# Patient Record
Sex: Male | Born: 1985 | Race: White | Hispanic: No | Marital: Single | Smoking: Never smoker
Health system: Southern US, Community
[De-identification: ages and names within clinical notes are randomized; demographics above are authoritative.]

## PROBLEM LIST (undated history)

## (undated) DIAGNOSIS — E538 Deficiency of other specified B group vitamins: Secondary | ICD-10-CM

## (undated) DIAGNOSIS — F4310 Post-traumatic stress disorder, unspecified: Secondary | ICD-10-CM

## (undated) DIAGNOSIS — D751 Secondary polycythemia: Secondary | ICD-10-CM

## (undated) HISTORY — PX: COLONOSCOPY W/ POLYPECTOMY: SHX1380

## (undated) HISTORY — PX: OTHER SURGICAL HISTORY: SHX169

## (undated) HISTORY — DX: Secondary polycythemia: D75.1

## (undated) HISTORY — PX: WISDOM TOOTH EXTRACTION: SHX21

## (undated) HISTORY — DX: Deficiency of other specified B group vitamins: E53.8

## (undated) HISTORY — DX: Post-traumatic stress disorder, unspecified: F43.10

---

## 2005-02-18 ENCOUNTER — Emergency Department: Payer: Self-pay | Admitting: Unknown Physician Specialty

## 2005-02-18 ENCOUNTER — Other Ambulatory Visit: Payer: Self-pay

## 2005-02-19 ENCOUNTER — Other Ambulatory Visit: Payer: Self-pay

## 2005-11-18 ENCOUNTER — Emergency Department: Payer: Self-pay | Admitting: Unknown Physician Specialty

## 2009-02-05 ENCOUNTER — Emergency Department: Payer: Self-pay | Admitting: Emergency Medicine

## 2009-03-14 ENCOUNTER — Ambulatory Visit: Payer: Self-pay | Admitting: General Surgery

## 2012-05-25 ENCOUNTER — Encounter: Payer: Self-pay | Admitting: *Deleted

## 2012-06-03 ENCOUNTER — Encounter: Payer: Self-pay | Admitting: General Surgery

## 2012-06-11 ENCOUNTER — Encounter: Payer: Self-pay | Admitting: *Deleted

## 2015-01-02 ENCOUNTER — Emergency Department
Admission: EM | Admit: 2015-01-02 | Discharge: 2015-01-02 | Disposition: A | Discharge status: Home / Self Care | Payer: BLUE CROSS/BLUE SHIELD | Attending: Emergency Medicine | Admitting: Emergency Medicine

## 2015-01-02 ENCOUNTER — Encounter: Payer: Self-pay | Admitting: Emergency Medicine

## 2015-01-02 DIAGNOSIS — H16132 Photokeratitis, left eye: Secondary | ICD-10-CM | POA: Diagnosis not present

## 2015-01-02 DIAGNOSIS — H16133 Photokeratitis, bilateral: Secondary | ICD-10-CM

## 2015-01-02 DIAGNOSIS — H16131 Photokeratitis, right eye: Secondary | ICD-10-CM | POA: Insufficient documentation

## 2015-01-02 DIAGNOSIS — H5713 Ocular pain, bilateral: Secondary | ICD-10-CM | POA: Diagnosis present

## 2015-01-02 MED ORDER — OXYCODONE-ACETAMINOPHEN 5-325 MG PO TABS: 1.0000 | ORAL_TABLET | ORAL | Status: DC | PRN

## 2015-01-02 MED ORDER — ERYTHROMYCIN 5 MG/GM OP OINT: 1.0000 "application " | TOPICAL_OINTMENT | Freq: Three times a day (TID) | OPHTHALMIC | Status: DC

## 2015-01-02 MED ORDER — ERYTHROMYCIN 5 MG/GM OP OINT
TOPICAL_OINTMENT | Freq: Once | OPHTHALMIC | Status: AC
  Administered 2015-01-02: 1 via OPHTHALMIC
  Filled 2015-01-02: qty 1

## 2015-01-02 NOTE — ED Notes (Signed)
Pt in with co pain and redness to both eyes after using torch at  Home with minimal eye protection.

## 2015-01-02 NOTE — ED Provider Notes (Signed)
[facility] Emergency Department Provider Note  ____________________________________________  Time seen: Approximately 11:17 PM  I have reviewed the triage vital signs and the nursing notes.   HISTORY  Chief Complaint Eye Pain    HPI GAWAIN TROKEY is a 29 y.o. male patient presents with redness to pain in both eyes after welding. Patient states he used minimal eye protection. Denies any direct trauma to his eyes.States welded for approximately 30 minutes   History reviewed. No pertinent past medical history.  There are no active problems to display for this patient.   Past Surgical History  Procedure Laterality Date  . Colonoscopy w/ polypectomy  [phone]    Dr. Bary Castilla    Current Outpatient Rx  Name  Route  Sig  Dispense  Refill  . erythromycin ophthalmic ointment   Both Eyes   Place 1 application into both eyes 3 (three) times daily.   3.5 g   0   . oxyCODONE-acetaminophen (ROXICET) 5-325 MG tablet   Oral   Take 1-2 tablets by mouth every 4 (four) hours as needed for severe pain.   15 tablet   0     Allergies Review of patient's allergies indicates no known allergies.  Family History  Problem Relation Age of Onset  . Colon polyps Sister   . Colon polyps Maternal Grandfather     Social History Social History  Substance Use Topics  . Smoking status: Never Smoker   . Smokeless tobacco: None  . Alcohol Use: No    Review of Systems Constitutional: No fever/chills Eyes: Positive bilateral eye pain. ENT: No sore throat. Cardiovascular: Denies chest pain. Respiratory: Denies shortness of breath. Gastrointestinal: No abdominal pain.  No nausea, no vomiting.  No diarrhea.  No constipation. Genitourinary: Negative for dysuria. Musculoskeletal: Negative for back pain. Skin: Negative for rash. Neurological: Negative for headaches, focal weakness or numbness.  10-point ROS otherwise  negative.  ____________________________________________   PHYSICAL EXAM:  VITAL SIGNS: ED Triage Vitals  Enc Vitals Group     BP 01/02/15 2313 135/83 mmHg     Pulse Rate 01/02/15 2313 54     Resp 01/02/15 2313 18     Temp 01/02/15 2313 97.6 F (36.4 C)     Temp Source 01/02/15 2313 Oral     SpO2 01/02/15 2313 98 %     Weight 01/02/15 2313 167 lb (75.751 kg)     Height 01/02/15 2313 5\' 9"  (1.753 m)     Head Cir --      Peak Flow --      Pain Score 01/02/15 2314 8     Pain Loc --      Pain Edu? --      Excl. in [place]? --     Constitutional: Alert and oriented. Well appearing and in no acute distress. Eyes: Conjunctivae are erythematous bilaterally.Marland Kitchen PERRL. EOMI. Head: Atraumatic. Nose: No congestion/rhinnorhea. Mouth/Throat: Mucous membranes are moist.  Oropharynx non-erythematous. Neck: No stridor.   Cardiovascular: Normal rate, regular rhythm. Grossly normal heart sounds.  Good peripheral circulation. Respiratory: Normal respiratory effort.  No retractions. Lungs CTAB. Gastrointestinal: Soft and nontender. No distention. No abdominal bruits. No CVA tenderness. Musculoskeletal: No lower extremity tenderness nor edema.  No joint effusions. Neurologic:  Normal speech and language. No gross focal neurologic deficits are appreciated. No gait instability. Skin:  Skin is warm, dry and intact. No rash noted. Psychiatric: Mood and affect are normal. Speech and behavior are normal.  ____________________________________________   LABS (all  labs ordered are listed, but only abnormal results are displayed)  Labs Reviewed - No data to display    PROCEDURES  Procedure(s) performed: None  Critical Care performed: No  ____________________________________________   INITIAL IMPRESSION / ASSESSMENT AND PLAN / ED COURSE  Pertinent labs & imaging results that were available during my care of the patient were reviewed by me and considered in my medical decision making (see chart  for details).  Focal keratitis. Rx given for erythromycin ointment and Percocet for pain. Patient to follow up with PCP or ophthalmology on-call symptoms worsen. Patient voices no other emergency medical complaints at this time. ____________________________________________   FINAL CLINICAL IMPRESSION(S) / ED DIAGNOSES  Final diagnoses:  Photokeratitis of both eyes      Arlyss Repress, PA-C 01/02/15 2331  Orbie Pyo, MD 01/02/15 2342

## 2015-01-02 NOTE — Discharge Instructions (Signed)
How to Use Eye Drops and Eye Ointments  HOW TO APPLY EYE DROPS  Follow these steps when applying eye drops:  1. Wash your hands.  2. Tilt your head back.  3. Put a finger under your eye and use it to gently pull your lower lid downward. Keep that finger in place.  4. Using your other hand, hold the dropper between your thumb and index finger.  5. Position the dropper just over the edge of the lower lid. Hold it as close to your eye as you can without touching the dropper to your eye.  6. Steady your hand. One way to do this is to lean your index finger against your brow.  7. Look up.  8. Slowly and gently squeeze one drop of medicine into your eye.  9. Close your eye.  10. Place a finger between your lower eyelid and your nose. Press gently for 2 minutes. This increases the amount of time that the medicine is exposed to the eye. It also reduces side effects that can develop if the drop gets into the bloodstream through the nose.  HOW TO APPLY EYE OINTMENTS  Follow these steps when applying eye ointments:  1. Wash your hands.  2. Put a finger under your eye and use it to gently pull your lower lid downward. Keep that finger in place.  3. Using your other hand, place the tip of the tube between your thumb and index finger with the remaining fingers braced against your cheek or nose.  4. Hold the tube just over the edge of your lower lid without touching the tube to your lid or eyeball.  5. Look up.  6. Line the inner part of your lower lid with ointment.  7. Gently pull up on your upper lid and look down. This will force the ointment to spread over the surface of the eye.  8. Release the upper lid.  9. If you can, close your eyes for 1-2 minutes.  Do not rub your eyes. If you applied the ointment correctly, your vision will be blurry for a few minutes. This is normal.  ADDITIONAL INFORMATION   Make sure to use the eye drops or ointment as told by your health care provider.   If you have been told to use both eye  drops and an eye ointment, apply the eye drops first, then wait 3-4 minutes before you apply the ointment.   Try not to touch the tip of the dropper or tube to your eye. A dropper or tube that has touched the eye can become contaminated.     This information is not intended to replace advice given to you by your health care provider. Make sure you discuss any questions you have with your health care provider.     Document Released: 05/06/2000 Document Revised: 06/14/2014 Document Reviewed: 01/24/2014  Elsevier Interactive Patient Education 2016 [employer]

## 2015-02-12 DIAGNOSIS — D751 Secondary polycythemia: Secondary | ICD-10-CM

## 2015-02-12 HISTORY — DX: Secondary polycythemia: D75.1

## 2015-10-05 ENCOUNTER — Encounter: Payer: Self-pay | Admitting: *Deleted

## 2015-10-13 ENCOUNTER — Other Ambulatory Visit: Payer: Self-pay | Admitting: Internal Medicine

## 2015-10-13 DIAGNOSIS — R51 Headache: Principal | ICD-10-CM

## 2015-10-13 DIAGNOSIS — R519 Headache, unspecified: Secondary | ICD-10-CM

## 2015-10-18 ENCOUNTER — Other Ambulatory Visit: Payer: Self-pay | Admitting: Physician Assistant

## 2015-10-18 ENCOUNTER — Ambulatory Visit
Admission: RE | Admit: 2015-10-18 | Discharge: 2015-10-18 | Disposition: A | Discharge status: Home / Self Care | Payer: BLUE CROSS/BLUE SHIELD | Source: Ambulatory Visit | Attending: Physician Assistant | Admitting: Physician Assistant

## 2015-10-18 DIAGNOSIS — R0602 Shortness of breath: Secondary | ICD-10-CM

## 2015-10-18 DIAGNOSIS — J9811 Atelectasis: Secondary | ICD-10-CM | POA: Insufficient documentation

## 2015-10-18 DIAGNOSIS — R911 Solitary pulmonary nodule: Secondary | ICD-10-CM | POA: Insufficient documentation

## 2015-10-18 MED ORDER — IOPAMIDOL (ISOVUE-370) INJECTION 76%: 75.0000 mL | Freq: Once | INTRAVENOUS | Status: DC | PRN

## 2015-10-19 ENCOUNTER — Ambulatory Visit
Admission: RE | Admit: 2015-10-19 | Discharge: 2015-10-19 | Disposition: A | Discharge status: Home / Self Care | Payer: BLUE CROSS/BLUE SHIELD | Source: Ambulatory Visit | Attending: Internal Medicine | Admitting: Internal Medicine

## 2015-10-19 DIAGNOSIS — R519 Headache, unspecified: Secondary | ICD-10-CM

## 2015-10-19 DIAGNOSIS — R51 Headache: Secondary | ICD-10-CM | POA: Diagnosis not present

## 2015-10-25 ENCOUNTER — Inpatient Hospital Stay: Admission: RE | Admit: 2015-10-25 | Payer: BLUE CROSS/BLUE SHIELD | Source: Ambulatory Visit

## 2015-10-25 ENCOUNTER — Ambulatory Visit (INDEPENDENT_AMBULATORY_CARE_PROVIDER_SITE_OTHER): Payer: BLUE CROSS/BLUE SHIELD | Admitting: General Surgery

## 2015-10-25 ENCOUNTER — Encounter: Payer: Self-pay | Admitting: General Surgery

## 2015-10-25 VITALS — BP 122/68 | HR 66 | Resp 16 | Ht 69.0 in | Wt 165.0 lb

## 2015-10-25 DIAGNOSIS — Z86010 Personal history of colonic polyps: Secondary | ICD-10-CM | POA: Diagnosis not present

## 2015-10-25 MED ORDER — POLYETHYLENE GLYCOL 3350 17 GM/SCOOP PO POWD: 1.0000 | Freq: Once | ORAL | 0 refills | Status: AC

## 2015-10-25 NOTE — Progress Notes (Signed)
Patient ID: John Davis, male   DOB: 1985-02-16, 30 y.o.   MRN: [id number]  Chief Complaint  Patient presents with  . Colonoscopy    HPI John Davis is a 30 y.o. male Who presents for a colonoscopy discussion with history of colon polyps and family history of colon cancer. The last colonoscopy was completed in 2011. Denies any gastrointestinal issues. Bowels move regular and no bleeding noted.  He has been being followed for headaches and fatigue for unknown causes.  HPI  Past Medical History:  Diagnosis Date  . Polycythemia 2017   high hemoglobin  . PTSD (post-traumatic stress disorder)     Past Surgical History:  Procedure Laterality Date  . COLONOSCOPY W/ POLYPECTOMY  [phone]   Dr. Bary Castilla    Family History  Problem Relation Age of Onset  . Colon polyps Sister     half sister  . Colon polyps Maternal Grandfather   . Colon cancer Maternal Grandfather   . Colon cancer Mother     Social History Social History  Substance Use Topics  . Smoking status: Never Smoker  . Smokeless tobacco: Never Used  . Alcohol use No    No Known Allergies  No current outpatient prescriptions on file.   No current facility-administered medications for this visit.     Review of Systems Review of Systems  Constitutional: Positive for fatigue.  Respiratory: Negative.   Cardiovascular: Negative.   Neurological: Positive for headaches.    Blood pressure 122/68, pulse 66, resp. rate 16, height 5\' 9"  (1.753 m), weight 165 lb (74.8 kg).  Physical Exam Physical Exam  Constitutional: He is oriented to person, place, and time. He appears well-developed and well-nourished.  HENT:  Mouth/Throat: Oropharynx is clear and moist.  Eyes: Conjunctivae are normal.  Neck: Neck supple.  Cardiovascular: Normal rate, regular rhythm and normal heart sounds.   Pulmonary/Chest: Effort normal and breath sounds normal.  Abdominal: Soft.  Lymphadenopathy:    He has no cervical  adenopathy.  Neurological: He is alert and oriented to person, place, and time.  Skin: Skin is warm and dry.  Psychiatric: His behavior is normal.    Data Reviewed Laboratory studies dated 10/05/2015 completed [facility] showed a hemoglobin of 19.0 with a medicated 55, white blood cell count 7000. B-12 level was low at 192. ( greater than 300) Comprehensive metabolic panel was normal. Ferritin was normal at 42. Erythropoietin level was normal at 5.3. Carbon monoxide level was not elevated.  PCP notes of 10/05/2015 reviewed. Evaluated for temporal headache at that time.  Barium enema dated 04/09/1999 reported a granular appearance in the sigmoid and transverse colon. This prompted colonoscopy.  Colonoscopy report of 05/14/1999 showed no colonic polyps. Biopsy of the terminal ileum was normal.  Colonoscopy dated 03/14/2009 showed multiple polyps within the rectum, sigmoid and descending colon. These were all tubular adenomas. No high-grade dysplasia or malignancy.  Assessment    Candidate for follow-up colonoscopy based on multiple polyps identified in 2011.  Family history of familial polyposis in his mother and maternal grandfather. No known previous genetic testing.    Plan         Colonoscopy with possible biopsy/polypectomy prn: Information regarding the procedure, including its potential risks and complications (including but not limited to perforation of the bowel, which may require emergency surgery to repair, and bleeding) was verbally given to the patient. Educational information regarding lower intestinal endoscopy was given to the patient. Written instructions for how to complete the bowel prep  using Miralax were provided. The importance of drinking ample fluids to avoid dehydration as a result of the prep emphasized.  The patient is scheduled for a Colonoscopy at [facility] on 11/28/15. They are aware to call the day before to get their arrival time. Miralax prescription has been  sent into the patient's pharmacy. The patient is aware of date and instructions.    This information has been scribed by Karie Fetch RN, BSN,BC.   John Davis 10/27/2015, 9:03 AM

## 2015-10-25 NOTE — Patient Instructions (Addendum)
The patient is aware to call back for any questions or concerns.  Colonoscopy A colonoscopy is an exam to look at the entire large intestine (colon). This exam can help find problems such as tumors, polyps, inflammation, and areas of bleeding. The exam takes about 1 hour.  LET [facility] CARE PROVIDER KNOW ABOUT:   Any allergies you have.  All medicines you are taking, including vitamins, herbs, eye drops, creams, and over-the-counter medicines.  Previous problems you or members of your family have had with the use of anesthetics.  Any blood disorders you have.  Previous surgeries you have had.  Medical conditions you have. RISKS AND COMPLICATIONS  Generally, this is a safe procedure. However, as with any procedure, complications can occur. Possible complications include:  Bleeding.  Tearing or rupture of the colon wall.  Reaction to medicines given during the exam.  Infection (rare). BEFORE THE PROCEDURE   Ask your health care provider about changing or stopping your regular medicines.  You may be prescribed an oral bowel prep. This involves drinking a large amount of medicated liquid, starting the day before your procedure. The liquid will cause you to have multiple loose stools until your stool is almost clear or light green. This cleans out your colon in preparation for the procedure.  Do not eat or drink anything else once you have started the bowel prep, unless your health care provider tells you it is safe to do so.  Arrange for someone to drive you home after the procedure. PROCEDURE   You will be given medicine to help you relax (sedative).  You will lie on your side with your knees bent.  A long, flexible tube with a light and camera on the end (colonoscope) will be inserted through the rectum and into the colon. The camera sends video back to a computer screen as it moves through the colon. The colonoscope also releases carbon dioxide gas to inflate the colon.  This helps your health care provider see the area better.  During the exam, your health care provider may take a small tissue sample (biopsy) to be examined under a microscope if any abnormalities are found.  The exam is finished when the entire colon has been viewed. AFTER THE PROCEDURE   Do not drive for 24 hours after the exam.  You may have a small amount of blood in your stool.  You may pass moderate amounts of gas and have mild abdominal cramping or bloating. This is caused by the gas used to inflate your colon during the exam.  Ask when your test results will be ready and how you will get your results. Make sure you get your test results.   This information is not intended to replace advice given to you by your health care provider. Make sure you discuss any questions you have with your health care provider.   Document Released: 01/26/2000 Document Revised: 11/18/2012 Document Reviewed: 10/05/2012 Elsevier Interactive Patient Education [employer].  The patient is scheduled for a Colonoscopy at [facility] on 11/28/15. They are aware to call the day before to get their arrival time. Miralax prescription has been sent into the patient's pharmacy. The patient is aware of date and instructions.

## 2015-10-27 ENCOUNTER — Ambulatory Visit
Admission: RE | Admit: 2015-10-27 | Discharge: 2015-10-27 | Disposition: A | Discharge status: Home / Self Care | Payer: BLUE CROSS/BLUE SHIELD | Source: Ambulatory Visit | Attending: Internal Medicine | Admitting: Internal Medicine

## 2015-10-27 DIAGNOSIS — Z86010 Personal history of colonic polyps: Secondary | ICD-10-CM | POA: Insufficient documentation

## 2015-10-31 ENCOUNTER — Telehealth: Payer: Self-pay

## 2015-10-31 NOTE — Telephone Encounter (Signed)
Patient called needing to reschedule his colonoscopy scheduled for 11/28/15. He is now rescheduled for his colonoscopy on 12/26/15. He is aware of date and instructions. He will pick up a new instruction sheet tomorrow.

## 2015-11-01 ENCOUNTER — Inpatient Hospital Stay: Payer: BLUE CROSS/BLUE SHIELD | Admitting: Internal Medicine

## 2015-11-02 DIAGNOSIS — R030 Elevated blood-pressure reading, without diagnosis of hypertension: Secondary | ICD-10-CM | POA: Insufficient documentation

## 2015-11-02 DIAGNOSIS — R519 Headache, unspecified: Secondary | ICD-10-CM | POA: Insufficient documentation

## 2015-11-02 DIAGNOSIS — R51 Headache: Secondary | ICD-10-CM

## 2015-11-03 ENCOUNTER — Ambulatory Visit: Payer: BLUE CROSS/BLUE SHIELD | Admitting: Internal Medicine

## 2015-11-08 ENCOUNTER — Inpatient Hospital Stay: Payer: BLUE CROSS/BLUE SHIELD | Attending: Hematology and Oncology | Admitting: Hematology and Oncology

## 2015-11-08 ENCOUNTER — Encounter (INDEPENDENT_AMBULATORY_CARE_PROVIDER_SITE_OTHER): Payer: Self-pay

## 2015-11-08 ENCOUNTER — Inpatient Hospital Stay: Payer: BLUE CROSS/BLUE SHIELD

## 2015-11-08 VITALS — BP 116/79 | HR 62 | Resp 18

## 2015-11-08 DIAGNOSIS — Z86010 Personal history of colonic polyps: Secondary | ICD-10-CM | POA: Diagnosis not present

## 2015-11-08 DIAGNOSIS — D45 Polycythemia vera: Secondary | ICD-10-CM

## 2015-11-08 DIAGNOSIS — R0902 Hypoxemia: Secondary | ICD-10-CM | POA: Diagnosis not present

## 2015-11-08 DIAGNOSIS — D751 Secondary polycythemia: Secondary | ICD-10-CM | POA: Diagnosis present

## 2015-11-08 DIAGNOSIS — Y9301 Activity, walking, marching and hiking: Secondary | ICD-10-CM | POA: Insufficient documentation

## 2015-11-08 DIAGNOSIS — Z79899 Other long term (current) drug therapy: Secondary | ICD-10-CM | POA: Insufficient documentation

## 2015-11-08 DIAGNOSIS — E538 Deficiency of other specified B group vitamins: Secondary | ICD-10-CM | POA: Insufficient documentation

## 2015-11-08 DIAGNOSIS — R51 Headache: Secondary | ICD-10-CM

## 2015-11-08 HISTORY — DX: Deficiency of other specified B group vitamins: E53.8

## 2015-11-08 NOTE — Progress Notes (Signed)
[facility] NOTE  Patient Care Team: Adin Hector, MD as PCP - General (Internal Medicine) Adin Hector, MD as Referring Physician (Internal Medicine) Robert Bellow, MD (General Surgery)  CHIEF COMPLAINTS/PURPOSE OF CONSULTATION:  Erythrocytosis  HISTORY OF PRESENTING ILLNESS:  John Davis 30 y.o. male is here because of elevated hemoglobin.  He was found to have abnormal CBC from recent evaluation by primary care doctor. The patient complained of new onset of headaches over the past 2 - 3 months. I have the opportunity to review the outside records. At one point, his hemoglobin was as high as 19.1. He recalled having 2 units of blood removed recently with improvement of symptoms of headaches. Several weeks ago, he also complained of symptoms of chest pain/shortness of breath.  On 10/18/2015, he had CT angiogram which excluded diagnosis of blood clots and CT head was negative for abnormalities. He denies frequent leg cramps.  He never suffer from diagnosis of blood clot.  There is no prior diagnosis of obstructive sleep apnea. The patient denies weight loss or skin itching. The patient is not a smoker He does not use testosterone supplements. There is no family history of polycythemia. However, the patient had significant exposure to situations that could cause secondary polycythemia. 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 The patient had significant physical activities with twice a week hiking on mountains up to 6 hours each time. He would hike close to 20 miles with each trip. He also have regular hobby with deep sea scuba diving. He has family history of colon polyps. The patient himself was diagnosed with extensive colon polyps. His last colonoscopy was 6 years ago and he is due for repeat colonoscopy soon. Recently, in addition to above, he was also found to have significant vitamin  B-12 deficiency. He received vitamin B-12 injections and recently has started taking regular doses of over-the-counter vitamin B-12 supplements  MEDICAL HISTORY:  Past Medical History:  Diagnosis Date  . Polycythemia 2017   high hemoglobin  . PTSD (post-traumatic stress disorder)   . Vitamin B12 deficiency 11/08/2015    SURGICAL HISTORY: Past Surgical History:  Procedure Laterality Date  . COLONOSCOPY W/ POLYPECTOMY  [phone]   Dr. Bary Castilla    SOCIAL HISTORY: Social History   Social History  . Marital status: Single    Spouse name: N/A  . Number of children: N/A  . Years of education: N/A   Occupational History  . fireman    Social History Main Topics  . Smoking status: Never Smoker  . Smokeless tobacco: Never Used  . Alcohol use No  . Drug use: No  . Sexual activity: Not on file   Other Topics Concern  . Not on file   Social History Narrative  . No narrative on file    FAMILY HISTORY: Family History  Problem Relation Age of Onset  . Colon polyps Sister     half sister  . Colon polyps Maternal Grandfather   . Colon cancer Maternal Grandfather   . Colon cancer Mother     ALLERGIES:  has No Known Allergies.  MEDICATIONS:  Current Outpatient Prescriptions  Medication Sig Dispense Refill  . polyethylene glycol powder (GLYCOLAX/MIRALAX) powder as directed (Colonoscopy in November).      No current facility-administered medications for this visit.     REVIEW OF SYSTEMS:   Constitutional: Denies fevers, chills or  abnormal night sweats Eyes: Denies blurriness of vision, double vision or watery eyes Ears, nose, mouth, throat, and face: Denies mucositis or sore throat Respiratory: Denies cough, dyspnea or wheezes Cardiovascular: Denies palpitation, chest discomfort or lower extremity swelling Gastrointestinal:  Denies nausea, heartburn or change in bowel habits Skin: Denies abnormal skin rashes Lymphatics: Denies new lymphadenopathy or easy  bruising Neurological:Denies numbness, tingling or new weaknesses Behavioral/Psych: Mood is stable, no new changes  All other systems were reviewed with the patient and are negative.  PHYSICAL EXAMINATION: ECOG PERFORMANCE STATUS: 1 - Symptomatic but completely ambulatory  Vitals:   11/08/15 1004  BP: (!) 143/86  Pulse: 67  Resp: 18  Temp: 97.8 F (36.6 C)   Filed Weights   11/08/15 1004  Weight: 162 lb 4.1 oz (73.6 kg)    GENERAL:alert, no distress and comfortable SKIN: texture, turgor are normal, no rashes or significant lesions. His skin color looks plethoric EYES: normal, conjunctiva are pink and non-injected, sclera clear OROPHARYNX:no exudate, no erythema and lips, buccal mucosa, and tongue normal  NECK: supple, thyroid normal size, non-tender, without nodularity LYMPH:  no palpable lymphadenopathy in the cervical, axillary or inguinal LUNGS: clear to auscultation and percussion with normal breathing effort HEART: regular rate & rhythm and no murmurs and no lower extremity edema ABDOMEN:abdomen soft, non-tender and normal bowel sounds. No splenomegaly Musculoskeletal:no cyanosis of digits and no clubbing  PSYCH: alert & oriented x 3 with fluent speech NEURO: no focal motor/sensory deficits  LABORATORY DATA:  I have reviewed the data as listed. Reviewed his outside records   RADIOGRAPHIC STUDIES: I have personally reviewed the radiological images as listed and agreed with the findings in the report. Ct Head Wo Contrast  Result Date: 10/19/2015 CLINICAL DATA:  Headache for several weeks, initial encounter EXAM: CT HEAD WITHOUT CONTRAST TECHNIQUE: Contiguous axial images were obtained from the base of the skull through the vertex without intravenous contrast. COMPARISON:  11/18/2005 FINDINGS: Brain: No evidence of acute infarction, hemorrhage, hydrocephalus, extra-axial collection or mass lesion/mass effect. Vascular: No hyperdense vessel or unexpected calcification.  Skull: Normal. Negative for fracture or focal lesion. Sinuses/Orbits: No acute finding. IMPRESSION: No acute intracranial abnormality noted. Electronically Signed   By: Inez Catalina M.D.   On: 10/19/2015 10:57   Ct Angio Chest Pe W Or Wo Contrast  Result Date: 10/18/2015 CLINICAL DATA:  Patient states having of shortness of breath x 2 weeks and yesterday starting to have back pain. Patient is waiting for the result. EXAM: CT ANGIOGRAPHY CHEST WITH CONTRAST TECHNIQUE: Multidetector CT imaging of the chest was performed using the standard protocol during bolus administration of intravenous contrast. Multiplanar CT image reconstructions and MIPs were obtained to evaluate the vascular anatomy. CONTRAST:  75 mL of Isovue 370 intravenous contrast COMPARISON:  Chest radiograph, 02/19/2005 FINDINGS: Angiographic study: No evidence of a pulmonary embolism. Great vessels are normal in caliber. No aortic dissection or plaque. Neck base and axilla:  No mass or adenopathy. Mediastinum and hila: Heart normal in size and configuration. No mediastinal or hilar masses or adenopathy. Lungs and pleura: In the left lower lobe there is a 4.6 x 2.6 mm nodule (3.6 mm mean diameter) on image 62 of series 6. No other lung nodules. Minor medial lung base subsegmental atelectasis. Lungs are otherwise clear. No pleural effusion. No pneumothorax. Limited upper abdomen:  Unremarkable. Musculoskeletal:  Normal. Review of the MIP images confirms the above findings. IMPRESSION: 1. No evidence of a pulmonary embolus. 2. No acute findings. 3.  3.6 mm left lower lobe nodule. No additional followup recommended in this age group. 4. Minimal anteromedial lung base atelectasis. Lungs otherwise clear. Electronically Signed   By: Lajean Manes M.D.   On: 10/18/2015 15:48    ASSESSMENT & PLAN Polycythemia, secondary I reviewed his outside records. 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 Outside records revealed normal level of serum erythropoietin level and testosterone level. His primary care doctor has sent out blood test for JAK 2 mutation to exclude polycythemia vera. The patient is symptomatic with significant headaches. I recommend 1 unit of blood, roughly 600 mL phlebotomy weekly with target goal of hematocrit less than 48. Due to history of false positive screening test for hepatitis in the past, the patient is not allowed to donate blood at [facility] facility.  The risks, benefits, side effects of phlebotomy was discussed with the patient and he agreed to proceed I recommend daily 81 mg aspirin therapy. I do not feel it is necessary for him to modify his lifestyle such as restriction of his hobbies/hiking/scube diving activities. I will see him back roughly a month from now to determine the frequency of phlebotomy need once we can get his hemoglobin under control.  Hopefully, by then, we will received blood test results of his JAK 2 mutation study from outside facility.  History of colonic polyps He has strong family history of colon polyps and personal history of colon polyps. He is due for another screening colonoscopy soon. The patient is unclear regarding history of colon cancer in the family. We discussed potential referral to genetic counseling. He will think about it.  Vitamin B12 deficiency He had recent diagnosis of severe vitamin B-12 deficiency and is currently on replacement therapy. I recommend recheck serum vitamin B-12 level within the next 3-6 months to ensure adequate replacement, due to high level of consumption with secondary polycythemia and plan for regular phlebotomy  Orders Placed This Encounter  Procedures  . CBC with Differential/Platelet    Standing Status:   Standing    Number of Occurrences:   22    Standing Expiration Date:   11/07/2016      All questions were answered. The patient knows to call the clinic with any problems, questions or concerns. I spent 40 minutes counseling the patient face to face. The total time spent in the appointment was 55 minutes and more than 50% was on counseling.     Heath Lark, MD 11/08/2015 11:46 AM

## 2015-11-08 NOTE — Assessment & Plan Note (Addendum)
I reviewed his outside records. 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 Outside records revealed normal level of serum erythropoietin level and testosterone level. His primary care doctor has sent out blood test for JAK 2 mutation to exclude polycythemia vera. The patient is symptomatic with significant headaches. I recommend 1 unit of blood, roughly 600 mL phlebotomy weekly with target goal of hematocrit less than 48. Due to history of false positive screening test for hepatitis in the past, the patient is not allowed to donate blood at [facility] facility.  The risks, benefits, side effects of phlebotomy was discussed with the patient and he agreed to proceed I recommend daily 81 mg aspirin therapy. I do not feel it is necessary for him to modify his lifestyle such as restriction of his hobbies/hiking/scube diving activities. I will see him back roughly a month from now to determine the frequency of phlebotomy need once we can get his hemoglobin under control.  Hopefully, by then, we will received blood test results of his JAK 2 mutation study from outside facility.

## 2015-11-08 NOTE — Assessment & Plan Note (Signed)
He has strong family history of colon polyps and personal history of colon polyps. He is due for another screening colonoscopy soon. The patient is unclear regarding history of colon cancer in the family. We discussed potential referral to genetic counseling. He will think about it.

## 2015-11-08 NOTE — Assessment & Plan Note (Signed)
He had recent diagnosis of severe vitamin B-12 deficiency and is currently on replacement therapy. I recommend recheck serum vitamin B-12 level within the next 3-6 months to ensure adequate replacement, due to high level of consumption with secondary polycythemia and plan for regular phlebotomy

## 2015-11-15 ENCOUNTER — Inpatient Hospital Stay: Payer: BLUE CROSS/BLUE SHIELD

## 2015-11-15 ENCOUNTER — Inpatient Hospital Stay: Payer: BLUE CROSS/BLUE SHIELD | Attending: Hematology and Oncology

## 2015-11-15 DIAGNOSIS — E538 Deficiency of other specified B group vitamins: Secondary | ICD-10-CM | POA: Diagnosis not present

## 2015-11-15 DIAGNOSIS — R0902 Hypoxemia: Secondary | ICD-10-CM | POA: Diagnosis not present

## 2015-11-15 DIAGNOSIS — D751 Secondary polycythemia: Secondary | ICD-10-CM | POA: Diagnosis not present

## 2015-11-15 DIAGNOSIS — Z86010 Personal history of colonic polyps: Secondary | ICD-10-CM | POA: Insufficient documentation

## 2015-11-15 DIAGNOSIS — R51 Headache: Secondary | ICD-10-CM | POA: Diagnosis not present

## 2015-11-15 DIAGNOSIS — D45 Polycythemia vera: Secondary | ICD-10-CM

## 2015-11-15 LAB — CBC WITH DIFFERENTIAL/PLATELET
BASOS PCT: 1 %
Basophils Absolute: 0.1 10*3/uL (ref 0–0.1)
EOS ABS: 0.1 10*3/uL (ref 0–0.7)
EOS PCT: 1 %
HCT: 47.4 % (ref 40.0–52.0)
Hemoglobin: 16.2 g/dL (ref 13.0–18.0)
Lymphocytes Relative: 20 %
Lymphs Abs: 1.6 10*3/uL (ref 1.0–3.6)
MCH: 30.8 pg (ref 26.0–34.0)
MCHC: 34.2 g/dL (ref 32.0–36.0)
MCV: 90.1 fL (ref 80.0–100.0)
MONO ABS: 0.4 10*3/uL (ref 0.2–1.0)
MONOS PCT: 5 %
Neutro Abs: 6.1 10*3/uL (ref 1.4–6.5)
Neutrophils Relative %: 73 %
Platelets: 215 10*3/uL (ref 150–440)
RBC: 5.26 MIL/uL (ref 4.40–5.90)
RDW: 13.3 % (ref 11.5–14.5)
WBC: 8.3 10*3/uL (ref 3.8–10.6)

## 2015-11-17 ENCOUNTER — Ambulatory Visit: Payer: BLUE CROSS/BLUE SHIELD | Admitting: Oncology

## 2015-11-22 ENCOUNTER — Inpatient Hospital Stay: Payer: BLUE CROSS/BLUE SHIELD

## 2015-11-22 ENCOUNTER — Other Ambulatory Visit: Payer: Self-pay | Admitting: Hematology and Oncology

## 2015-11-22 VITALS — BP 118/76 | HR 60 | Resp 18

## 2015-11-22 DIAGNOSIS — D751 Secondary polycythemia: Secondary | ICD-10-CM | POA: Diagnosis not present

## 2015-11-22 DIAGNOSIS — D45 Polycythemia vera: Secondary | ICD-10-CM

## 2015-11-22 LAB — CBC WITH DIFFERENTIAL/PLATELET
BASOS PCT: 1 %
Basophils Absolute: 0.1 10*3/uL (ref 0–0.1)
EOS ABS: 0.1 10*3/uL (ref 0–0.7)
EOS PCT: 1 %
HCT: 50.2 % (ref 40.0–52.0)
Hemoglobin: 17.3 g/dL (ref 13.0–18.0)
Lymphocytes Relative: 26 %
Lymphs Abs: 2.3 10*3/uL (ref 1.0–3.6)
MCH: 30.9 pg (ref 26.0–34.0)
MCHC: 34.6 g/dL (ref 32.0–36.0)
MCV: 89.5 fL (ref 80.0–100.0)
MONO ABS: 0.6 10*3/uL (ref 0.2–1.0)
MONOS PCT: 7 %
Neutro Abs: 5.6 10*3/uL (ref 1.4–6.5)
Neutrophils Relative %: 65 %
PLATELETS: 215 10*3/uL (ref 150–440)
RBC: 5.61 MIL/uL (ref 4.40–5.90)
RDW: 13.7 % (ref 11.5–14.5)
WBC: 8.7 10*3/uL (ref 3.8–10.6)

## 2015-11-23 ENCOUNTER — Ambulatory Visit: Payer: BLUE CROSS/BLUE SHIELD | Admitting: Internal Medicine

## 2015-11-28 ENCOUNTER — Ambulatory Visit: Admit: 2015-11-28 | Payer: BLUE CROSS/BLUE SHIELD | Admitting: General Surgery

## 2015-11-28 SURGERY — COLONOSCOPY WITH PROPOFOL
Anesthesia: General

## 2015-11-29 ENCOUNTER — Inpatient Hospital Stay (HOSPITAL_BASED_OUTPATIENT_CLINIC_OR_DEPARTMENT_OTHER): Payer: BLUE CROSS/BLUE SHIELD | Admitting: Hematology and Oncology

## 2015-11-29 ENCOUNTER — Inpatient Hospital Stay: Payer: BLUE CROSS/BLUE SHIELD

## 2015-11-29 DIAGNOSIS — D45 Polycythemia vera: Secondary | ICD-10-CM

## 2015-11-29 DIAGNOSIS — D751 Secondary polycythemia: Secondary | ICD-10-CM

## 2015-11-29 DIAGNOSIS — T7840XA Allergy, unspecified, initial encounter: Secondary | ICD-10-CM | POA: Insufficient documentation

## 2015-11-29 DIAGNOSIS — K635 Polyp of colon: Secondary | ICD-10-CM | POA: Insufficient documentation

## 2015-11-29 LAB — CBC WITH DIFFERENTIAL/PLATELET
BASOS ABS: 0.1 10*3/uL (ref 0–0.1)
BASOS PCT: 1 %
EOS ABS: 0.1 10*3/uL (ref 0–0.7)
Eosinophils Relative: 1 %
HCT: 46.2 % (ref 40.0–52.0)
HEMOGLOBIN: 15.6 g/dL (ref 13.0–18.0)
Lymphocytes Relative: 22 %
Lymphs Abs: 1.4 10*3/uL (ref 1.0–3.6)
MCH: 30.7 pg (ref 26.0–34.0)
MCHC: 33.8 g/dL (ref 32.0–36.0)
MCV: 90.7 fL (ref 80.0–100.0)
MONOS PCT: 7 %
Monocytes Absolute: 0.4 10*3/uL (ref 0.2–1.0)
NEUTROS PCT: 69 %
Neutro Abs: 4.4 10*3/uL (ref 1.4–6.5)
Platelets: 203 10*3/uL (ref 150–440)
RBC: 5.1 MIL/uL (ref 4.40–5.90)
RDW: 13.3 % (ref 11.5–14.5)
WBC: 6.4 10*3/uL (ref 3.8–10.6)

## 2015-11-29 NOTE — Assessment & Plan Note (Signed)
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 Outside records revealed normal level of serum erythropoietin level and testosterone level. His primary care doctor has sent out blood test for JAK 2 mutation to exclude polycythemia vera and result was negative. The patient is symptomatic with significant headaches whenever his hemoglobin is greater than 17. I recommend 1 unit of blood, roughly 600 mL phlebotomy weekly with target goal of hematocrit less than 48. After several units of blood removed, he felt better Due to history of false positive screening test for hepatitis in the past, the patient is not allowed to donate blood at [facility] facility.  The risks, benefits, side effects of phlebotomy was discussed with the patient and he agreed to proceed I recommend daily 81 mg aspirin therapy. I do not feel it is necessary for him to modify his lifestyle such as restriction of his hobbies/hiking/scube diving activities. I recommend changing the frequency of blood draw with phlebotomy as needed to once a month. We discussed difficulties of guessing how frequent he would need phlebotomy due to his sporadic activities The patient is comfortable with the plan of care

## 2015-11-29 NOTE — Progress Notes (Signed)
[place] III, MD SUMMARY OF HEMATOLOGIC HISTORY:  John Davis 30 y.o. male is here because of elevated hemoglobin.  He was found to have abnormal CBC from recent evaluation by primary care doctor. The patient complained of new onset of headaches over the past 2 - 3 months. I have the opportunity to review the outside records. At one point, his hemoglobin was as high as 19.1. He recalled having 2 units of blood removed recently with improvement of symptoms of headaches. Several weeks ago, he also complained of symptoms of chest pain/shortness of breath.  On 10/18/2015, he had CT angiogram which excluded diagnosis of blood clots and CT head was negative for abnormalities. He denies frequent leg cramps.  He never suffer from diagnosis of blood clot.  There is no prior diagnosis of obstructive sleep apnea. The patient denies weight loss or skin itching. The patient is not a smoker He does not use testosterone supplements. There is no family history of polycythemia. However, the patient had significant exposure to situations that could cause secondary polycythemia. 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 The patient had significant physical activities with twice a week hiking on mountains up to 6 hours each time. He would hike close to 20 miles with each trip. He also have regular hobby with deep sea scuba diving. He has family history of colon polyps. The patient himself was diagnosed with extensive colon polyps. His last colonoscopy was 6 years ago and he is due for repeat colonoscopy soon. Recently, in addition to above, he was also found to have significant vitamin B-12 deficiency. He received vitamin B-12 injections and recently has started taking regular doses of over-the-counter vitamin B-12 supplements He has extensive outside evaluation which excluded polycythemia vera. The  causes of polycythemia is likely due to secondary erythrocytosis from recurrent exposure to hypoxemic situations such as hiking in high altitudes and deep sea scuba driving  INTERVAL HISTORY: John Davis 30 y.o. male returns for further follow-up. Diffuse well. He denies further headaches. He tolerated phlebotomy well.  I have reviewed the past medical history, past surgical history, social history and family history with the patient and they are unchanged from previous note.  ALLERGIES:  has No Known Allergies.  MEDICATIONS:  No current outpatient prescriptions on file.   No current facility-administered medications for this visit.      REVIEW OF SYSTEMS:   Constitutional: Denies fevers, chills or night sweats Eyes: Denies blurriness of vision Ears, nose, mouth, throat, and face: Denies mucositis or sore throat Respiratory: Denies cough, dyspnea or wheezes Cardiovascular: Denies palpitation, chest discomfort or lower extremity swelling Gastrointestinal:  Denies nausea, heartburn or change in bowel habits Skin: Denies abnormal skin rashes Lymphatics: Denies new lymphadenopathy or easy bruising Neurological:Denies numbness, tingling or new weaknesses Behavioral/Psych: Mood is stable, no new changes  All other systems were reviewed with the patient and are negative.  PHYSICAL EXAMINATION: ECOG PERFORMANCE STATUS: 0 - Asymptomatic  Vitals:   11/29/15 1020  BP: 133/83  Pulse: (!) 56  Temp: 97.7 F (36.5 C)   Filed Weights   11/29/15 1020  Weight: 163 lb 9.3 oz (74.2 kg)    GENERAL:alert, no distress and comfortable SKIN: skin color, texture, turgor are normal, no rashes or significant lesions EYES: normal, Conjunctiva are pink and non-injected, sclera clear Musculoskeletal:no cyanosis of digits and no clubbing  NEURO: alert & oriented x 3 with fluent speech, no focal motor/sensory deficits  LABORATORY DATA:  I have reviewed the data as listed  No results found  for: NA, K, CL, CO2, GLUCOSE, BUN, CREATININE, CALCIUM, PROT, ALBUMIN, AST, ALT, ALKPHOS, BILITOT, GFRNONAA, GFRAA  No results found for: SPEP, UPEP  Lab Results  Component Value Date   WBC 6.4 11/29/2015   NEUTROABS 4.4 11/29/2015   HGB 15.6 11/29/2015   HCT 46.2 11/29/2015   MCV 90.7 11/29/2015   PLT 203 11/29/2015      Chemistry   No results found for: NA, K, CL, CO2, BUN, CREATININE, GLU No results found for: CALCIUM, ALKPHOS, AST, ALT, BILITOT     ASSESSMENT & PLAN:  Polycythemia The most likely cause of his polycythemia is 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 Outside records revealed normal level of serum erythropoietin level and testosterone level. His primary care doctor has sent out blood test for JAK 2 mutation to exclude polycythemia vera and result was negative. The patient is symptomatic with significant headaches whenever his hemoglobin is greater than 17. I recommend 1 unit of blood, roughly 600 mL phlebotomy weekly with target goal of hematocrit less than 48. After several units of blood removed, he felt better Due to history of false positive screening test for hepatitis in the past, the patient is not allowed to donate blood at [facility] facility.  The risks, benefits, side effects of phlebotomy was discussed with the patient and he agreed to proceed I recommend daily 81 mg aspirin therapy. I do not feel it is necessary for him to modify his lifestyle such as restriction of his hobbies/hiking/scube diving activities. I recommend changing the frequency of blood draw with phlebotomy as needed to once a month. We discussed difficulties of guessing how frequent he would need phlebotomy due to his sporadic activities The patient is comfortable with the plan of care   No orders of the defined types were placed in this encounter.   All questions were  answered. The patient knows to call the clinic with any problems, questions or concerns. No barriers to learning was detected.  I spent 5 minutes counseling the patient face to face. The total time spent in the appointment was 10 minutes and more than 50% was on counseling.     Heath Lark, MD 10/18/201711:10 AM

## 2015-12-14 ENCOUNTER — Ambulatory Visit
Admission: RE | Admit: 2015-12-14 | Discharge: 2015-12-14 | Disposition: A | Discharge status: Home / Self Care | Payer: BLUE CROSS/BLUE SHIELD | Source: Ambulatory Visit | Attending: Internal Medicine | Admitting: Internal Medicine

## 2015-12-14 DIAGNOSIS — D751 Secondary polycythemia: Secondary | ICD-10-CM | POA: Insufficient documentation

## 2015-12-14 LAB — CBC
HCT: 48.9 % (ref 40.0–52.0)
HEMOGLOBIN: 16.8 g/dL (ref 13.0–18.0)
MCH: 31.3 pg (ref 26.0–34.0)
MCHC: 34.3 g/dL (ref 32.0–36.0)
MCV: 91.1 fL (ref 80.0–100.0)
PLATELETS: 198 10*3/uL (ref 150–440)
RBC: 5.37 MIL/uL (ref 4.40–5.90)
RDW: 13.5 % (ref 11.5–14.5)
WBC: 9.9 10*3/uL (ref 3.8–10.6)

## 2015-12-21 ENCOUNTER — Ambulatory Visit
Admission: RE | Admit: 2015-12-21 | Discharge: 2015-12-21 | Disposition: A | Discharge status: Home / Self Care | Payer: BLUE CROSS/BLUE SHIELD | Source: Ambulatory Visit | Attending: Internal Medicine | Admitting: Internal Medicine

## 2015-12-21 ENCOUNTER — Telehealth: Payer: Self-pay | Admitting: *Deleted

## 2015-12-21 DIAGNOSIS — D751 Secondary polycythemia: Secondary | ICD-10-CM | POA: Insufficient documentation

## 2015-12-21 NOTE — Progress Notes (Signed)
IV removed, bandage applied. Patient tolerated well. Ambulated to lobby for discharge to home.

## 2015-12-21 NOTE — Telephone Encounter (Signed)
Message left on cell number for patient to call the office.   Patient is currently scheduled for a colonoscopy on 12-26-15 at [facility] with Dr. Bary Castilla.  We need to confirm patient is not on any medications at this time. Also, need to make sure he has Miralax prescription.

## 2015-12-21 NOTE — Telephone Encounter (Signed)
Patient called the office back to confirm he is not taking any medications at this time.   Also, states he has picked up Miralax prescription.   We will proceed with colonoscopy as scheduled.   This patient was instructed to call the office should he have further questions.

## 2015-12-25 ENCOUNTER — Encounter: Payer: Self-pay | Admitting: *Deleted

## 2015-12-26 ENCOUNTER — Encounter
Admission: RE | Disposition: A | Discharge status: Home / Self Care | Payer: Self-pay | Source: Ambulatory Visit | Attending: General Surgery

## 2015-12-26 ENCOUNTER — Encounter: Payer: Self-pay | Admitting: *Deleted

## 2015-12-26 ENCOUNTER — Ambulatory Visit: Payer: BLUE CROSS/BLUE SHIELD | Admitting: Anesthesiology

## 2015-12-26 ENCOUNTER — Ambulatory Visit
Admission: RE | Admit: 2015-12-26 | Discharge: 2015-12-26 | Disposition: A | Discharge status: Home / Self Care | Payer: BLUE CROSS/BLUE SHIELD | Source: Ambulatory Visit | Attending: General Surgery | Admitting: General Surgery

## 2015-12-26 DIAGNOSIS — D123 Benign neoplasm of transverse colon: Secondary | ICD-10-CM | POA: Diagnosis not present

## 2015-12-26 DIAGNOSIS — D122 Benign neoplasm of ascending colon: Secondary | ICD-10-CM | POA: Diagnosis not present

## 2015-12-26 DIAGNOSIS — Z1211 Encounter for screening for malignant neoplasm of colon: Secondary | ICD-10-CM | POA: Insufficient documentation

## 2015-12-26 DIAGNOSIS — Z8371 Family history of colonic polyps: Secondary | ICD-10-CM | POA: Insufficient documentation

## 2015-12-26 DIAGNOSIS — D120 Benign neoplasm of cecum: Secondary | ICD-10-CM | POA: Insufficient documentation

## 2015-12-26 HISTORY — PX: COLONOSCOPY WITH PROPOFOL: SHX5780

## 2015-12-26 SURGERY — COLONOSCOPY WITH PROPOFOL
Anesthesia: General

## 2015-12-26 MED ORDER — SODIUM CHLORIDE 0.9 % IV SOLN
INTRAVENOUS | Status: DC
  Administered 2015-12-26: 1000 mL via INTRAVENOUS

## 2015-12-26 MED ORDER — PROPOFOL 10 MG/ML IV BOLUS
INTRAVENOUS | Status: DC | PRN
  Administered 2015-12-26: 100 mg via INTRAVENOUS

## 2015-12-26 MED ORDER — MIDAZOLAM HCL 2 MG/2ML IJ SOLN
INTRAMUSCULAR | Status: DC | PRN
  Administered 2015-12-26 (×2): 1 mg via INTRAVENOUS

## 2015-12-26 MED ORDER — PROPOFOL 500 MG/50ML IV EMUL
INTRAVENOUS | Status: DC | PRN
  Administered 2015-12-26: 120 ug/kg/min via INTRAVENOUS

## 2015-12-26 NOTE — Anesthesia Preprocedure Evaluation (Signed)
Anesthesia Evaluation  Patient identified by MRN, date of birth, ID band Patient awake    Reviewed: Allergy & Precautions, NPO status , Patient's Chart, lab work & pertinent test results  History of Anesthesia Complications Negative for: history of anesthetic complications  Airway Mallampati: II       Dental   Pulmonary neg pulmonary ROS,           Cardiovascular negative cardio ROS       Neuro/Psych PSYCHIATRIC DISORDERS (PTSD) negative neurological ROS     GI/Hepatic negative GI ROS, Neg liver ROS,   Endo/Other  negative endocrine ROS  Renal/GU negative Renal ROS     Musculoskeletal   Abdominal   Peds  Hematology negative hematology ROS (+)   Anesthesia Other Findings   Reproductive/Obstetrics                             Anesthesia Physical Anesthesia Plan  ASA: II  Anesthesia Plan: General   Post-op Pain Management:    Induction: Intravenous  Airway Management Planned: Oral ETT  Additional Equipment:   Intra-op Plan:   Post-operative Plan:   Informed Consent: I have reviewed the patients History and Physical, chart, labs and discussed the procedure including the risks, benefits and alternatives for the proposed anesthesia with the patient or authorized representative who has indicated his/her understanding and acceptance.     Plan Discussed with:   Anesthesia Plan Comments:         Anesthesia Quick Evaluation

## 2015-12-26 NOTE — Op Note (Signed)
[facility] Gastroenterology Patient Name: John Davis Procedure Date: 12/26/2015 4:57 PM MRN: [id number] Account #: [id number] Date of Birth: [birth year] Admit Type: Outpatient Age: 30 Room: [facility] ENDO ROOM 4 Gender: Male Note Status: Finalized Procedure:            Colonoscopy Indications:          Family history of familial adenomatous polyposis in a                        first-degree relative Providers:            Robert Bellow, MD Referring MD:         Ramonita Lab, MD (Referring MD) Medicines:            Monitored Anesthesia Care Complications:        No immediate complications. Procedure:            Pre-Anesthesia Assessment:                       - Prior to the procedure, a History and Physical was                        performed, and patient medications, allergies and                        sensitivities were reviewed. The patient's tolerance of                        previous anesthesia was reviewed.                       - The risks and benefits of the procedure and the                        sedation options and risks were discussed with the                        patient. All questions were answered and informed                        consent was obtained.                       After obtaining informed consent, the colonoscope was                        passed under direct vision. Throughout the procedure,                        the patient's blood pressure, pulse, and oxygen                        saturations were monitored continuously. The                        Colonoscope was introduced through the anus and                        advanced to the the cecum, identified by appendiceal  orifice and ileocecal valve. The colonoscopy was                        performed without difficulty. The patient tolerated the                        procedure well. The quality of the bowel preparation                        was  excellent. Findings:      Two sessile polyps were found in the hepatic flexure. The polyps were 5       mm in size. These were biopsied with a cold forceps for histology.      A 5 mm polyp was found in the cecum. The polyp was sessile. Biopsies       were taken with a cold forceps for histology.      Six sessile polyps were found in the ascending colon. The polyps were 5       to 10 mm in size. These polyps were removed with a hot snare. Resection       and retrieval were complete.      The retroflexed view of the distal rectum and anal verge was normal and       showed no anal or rectal abnormalities. Impression:           - Two 5 mm polyps at the hepatic flexure. Biopsied.                       - One 5 mm polyp in the cecum. Biopsied.                       - Six 5 to 10 mm polyps in the ascending colon, removed                        with a hot snare. Resected and retrieved.                       - The distal rectum and anal verge are normal on                        retroflexion view. Recommendation:       - Telephone endoscopist for pathology results in 1 week.                       - Repeat colonoscopy in 3 years for surveillance. Procedure Code(s):    --- Professional ---                       [id number], Colonoscopy, flexible; with removal of tumor(s),                        polyp(s), or other lesion(s) by snare technique                       [id number], 16, Colonoscopy, flexible; with biopsy, single                        or multiple Diagnosis Code(s):    --- Professional ---  Z83.71, Family history of colonic polyps                       D12.0, Benign neoplasm of cecum                       D12.2, Benign neoplasm of ascending colon                       D12.3, Benign neoplasm of transverse colon (hepatic                        flexure or splenic flexure) CPT copyright 2016 American Medical Association. All rights reserved. The codes documented in this report are  preliminary and upon coder review may  be revised to meet current compliance requirements. Robert Bellow, MD 12/26/2015 5:31:52 PM This report has been signed electronically. Number of Addenda: 0 Note Initiated On: 12/26/2015 4:57 PM Scope Withdrawal Time: 0 hours 16 minutes 37 seconds  Total Procedure Duration: 0 hours 25 minutes 14 seconds       [facility]

## 2015-12-26 NOTE — Transfer of Care (Signed)
Immediate Anesthesia Transfer of Care Note  Patient: John Davis  Procedure(s) Performed: Procedure(s): COLONOSCOPY WITH PROPOFOL (N/A)  Patient Location: PACU and Endoscopy Unit  Anesthesia Type:General  Level of Consciousness: patient cooperative and lethargic  Airway & Oxygen Therapy: Patient Spontanous Breathing and Patient connected to nasal cannula oxygen  Post-op Assessment: Report given to RN and Post -op Vital signs reviewed and stable  Post vital signs: Reviewed and stable  Last Vitals:  Vitals:   12/26/15 1516 12/26/15 1734  BP: 134/71 101/64  Pulse: (!) 51 62  Resp: 20 16  Temp: 36.2 C     Last Pain:  Vitals:   12/26/15 1516  TempSrc: Tympanic         Complications: No apparent anesthesia complications

## 2015-12-26 NOTE — H&P (Signed)
John Davis [id number] 01-28-1986     HPI:  30 y.o male with family history of familial polyposis. For screening exam.   No prescriptions prior to admission.   No Known Allergies Past Medical History:  Diagnosis Date  . Polycythemia 2017   high hemoglobin  . PTSD (post-traumatic stress disorder)   . Vitamin B12 deficiency 11/08/2015   Past Surgical History:  Procedure Laterality Date  . COLONOSCOPY W/ POLYPECTOMY  [id number]   Dr. Bary Castilla   Social History   Social History  . Marital status: Single    Spouse name: N/A  . Number of children: N/A  . Years of education: N/A   Occupational History  . fireman    Social History Main Topics  . Smoking status: Never Smoker  . Smokeless tobacco: Never Used  . Alcohol use No  . Drug use: No  . Sexual activity: Not on file   Other Topics Concern  . Not on file   Social History Narrative  . No narrative on file   Social History   Social History Narrative  . No narrative on file     ROS: Negative.     PE: HEENT: Negative. Lungs: Clear. Cardio: RR.  Assessment/Plan:  Proceed with planned endoscopy.     Robert Bellow 12/26/2015

## 2015-12-26 NOTE — Anesthesia Postprocedure Evaluation (Signed)
Anesthesia Post Note  Patient: John Davis  Procedure(s) Performed: Procedure(s) (LRB): COLONOSCOPY WITH PROPOFOL (N/A)  Patient location during evaluation: Endoscopy Anesthesia Type: General Level of consciousness: awake and alert Pain management: pain level controlled Vital Signs Assessment: post-procedure vital signs reviewed and stable Respiratory status: spontaneous breathing and respiratory function stable Cardiovascular status: stable Anesthetic complications: no    Last Vitals:  Vitals:   12/26/15 1753 12/26/15 1804  BP: 115/90 124/83  Pulse: 70 63  Resp: 11 13  Temp:      Last Pain:  Vitals:   12/26/15 1734  TempSrc: Tympanic                 Kayleena Eke K

## 2015-12-27 ENCOUNTER — Encounter: Payer: Self-pay | Admitting: General Surgery

## 2015-12-28 ENCOUNTER — Telehealth: Payer: Self-pay

## 2015-12-28 LAB — SURGICAL PATHOLOGY

## 2015-12-28 NOTE — Telephone Encounter (Signed)
-----   Message from Robert Bellow, MD sent at 12/28/2015  1:51 PM EST ----- Please notify the patient has all the polyps removed were benign. He comes of the number, he is encouraged to have a follow-up exam in 3 years.  Please put him in recalls. Thank you ----- Message ----- From: Interface, Lab In [facility] One Sent: 12/28/2015   1:24 PM To: Robert Bellow, MD

## 2016-01-10 ENCOUNTER — Inpatient Hospital Stay: Payer: BLUE CROSS/BLUE SHIELD | Attending: Hematology and Oncology

## 2016-01-10 ENCOUNTER — Encounter: Payer: Self-pay | Admitting: General Surgery

## 2016-01-10 ENCOUNTER — Inpatient Hospital Stay: Payer: BLUE CROSS/BLUE SHIELD

## 2016-01-10 DIAGNOSIS — D45 Polycythemia vera: Secondary | ICD-10-CM

## 2016-01-10 DIAGNOSIS — Z86010 Personal history of colonic polyps: Secondary | ICD-10-CM | POA: Diagnosis not present

## 2016-01-10 DIAGNOSIS — D751 Secondary polycythemia: Secondary | ICD-10-CM | POA: Diagnosis present

## 2016-01-10 DIAGNOSIS — R51 Headache: Secondary | ICD-10-CM | POA: Insufficient documentation

## 2016-01-10 DIAGNOSIS — E538 Deficiency of other specified B group vitamins: Secondary | ICD-10-CM | POA: Insufficient documentation

## 2016-01-10 DIAGNOSIS — R0902 Hypoxemia: Secondary | ICD-10-CM | POA: Insufficient documentation

## 2016-01-10 LAB — CBC WITH DIFFERENTIAL/PLATELET
Basophils Absolute: 0.1 10*3/uL (ref 0–0.1)
Basophils Relative: 1 %
EOS ABS: 0.1 10*3/uL (ref 0–0.7)
Eosinophils Relative: 1 %
HEMATOCRIT: 46.6 % (ref 40.0–52.0)
HEMOGLOBIN: 16 g/dL (ref 13.0–18.0)
LYMPHS ABS: 1.4 10*3/uL (ref 1.0–3.6)
Lymphocytes Relative: 15 %
MCH: 29.7 pg (ref 26.0–34.0)
MCHC: 34.4 g/dL (ref 32.0–36.0)
MCV: 86.4 fL (ref 80.0–100.0)
Monocytes Absolute: 0.5 10*3/uL (ref 0.2–1.0)
Monocytes Relative: 6 %
NEUTROS ABS: 7.5 10*3/uL — AB (ref 1.4–6.5)
NEUTROS PCT: 77 %
Platelets: 257 10*3/uL (ref 150–440)
RBC: 5.39 MIL/uL (ref 4.40–5.90)
RDW: 12.4 % (ref 11.5–14.5)
WBC: 9.6 10*3/uL (ref 3.8–10.6)

## 2016-01-11 ENCOUNTER — Encounter: Payer: Self-pay | Admitting: General Surgery

## 2016-01-31 ENCOUNTER — Other Ambulatory Visit: Payer: BLUE CROSS/BLUE SHIELD

## 2016-01-31 ENCOUNTER — Inpatient Hospital Stay: Payer: BLUE CROSS/BLUE SHIELD

## 2016-02-01 ENCOUNTER — Ambulatory Visit
Admission: RE | Admit: 2016-02-01 | Discharge: 2016-02-01 | Disposition: A | Discharge status: Home / Self Care | Payer: BLUE CROSS/BLUE SHIELD | Source: Ambulatory Visit | Attending: Internal Medicine | Admitting: Internal Medicine

## 2016-02-22 ENCOUNTER — Encounter
Admission: RE | Admit: 2016-02-22 | Discharge: 2016-02-22 | Disposition: A | Discharge status: Home / Self Care | Payer: BLUE CROSS/BLUE SHIELD | Source: Ambulatory Visit | Attending: Internal Medicine | Admitting: Internal Medicine

## 2016-02-22 DIAGNOSIS — D751 Secondary polycythemia: Secondary | ICD-10-CM | POA: Diagnosis present

## 2016-02-22 LAB — HEMOGLOBIN AND HEMATOCRIT, BLOOD
HCT: 46.9 % (ref 40.0–52.0)
HEMOGLOBIN: 15.6 g/dL (ref 13.0–18.0)

## 2016-03-06 ENCOUNTER — Inpatient Hospital Stay: Payer: BLUE CROSS/BLUE SHIELD | Attending: Oncology

## 2016-03-06 ENCOUNTER — Other Ambulatory Visit: Payer: BLUE CROSS/BLUE SHIELD

## 2016-03-06 ENCOUNTER — Inpatient Hospital Stay: Payer: BLUE CROSS/BLUE SHIELD

## 2016-03-22 ENCOUNTER — Ambulatory Visit
Admission: RE | Admit: 2016-03-22 | Discharge: 2016-03-22 | Disposition: A | Discharge status: Home / Self Care | Payer: BLUE CROSS/BLUE SHIELD | Source: Ambulatory Visit | Attending: Internal Medicine | Admitting: Internal Medicine

## 2016-03-22 DIAGNOSIS — D751 Secondary polycythemia: Secondary | ICD-10-CM | POA: Diagnosis present

## 2016-04-03 ENCOUNTER — Inpatient Hospital Stay: Payer: BLUE CROSS/BLUE SHIELD

## 2016-04-03 ENCOUNTER — Other Ambulatory Visit: Payer: BLUE CROSS/BLUE SHIELD

## 2016-04-03 ENCOUNTER — Inpatient Hospital Stay: Payer: BLUE CROSS/BLUE SHIELD | Attending: Oncology

## 2016-05-01 ENCOUNTER — Inpatient Hospital Stay: Payer: BLUE CROSS/BLUE SHIELD | Attending: Internal Medicine

## 2016-05-01 ENCOUNTER — Other Ambulatory Visit: Payer: BLUE CROSS/BLUE SHIELD

## 2016-05-01 ENCOUNTER — Inpatient Hospital Stay: Payer: BLUE CROSS/BLUE SHIELD

## 2016-05-29 ENCOUNTER — Other Ambulatory Visit: Payer: BLUE CROSS/BLUE SHIELD

## 2016-05-29 ENCOUNTER — Ambulatory Visit: Payer: BLUE CROSS/BLUE SHIELD

## 2016-05-30 ENCOUNTER — Inpatient Hospital Stay: Payer: BLUE CROSS/BLUE SHIELD | Admitting: Oncology

## 2016-05-30 ENCOUNTER — Inpatient Hospital Stay: Payer: BLUE CROSS/BLUE SHIELD

## 2016-05-30 ENCOUNTER — Inpatient Hospital Stay: Payer: BLUE CROSS/BLUE SHIELD | Attending: Oncology

## 2016-10-31 ENCOUNTER — Ambulatory Visit
Admission: RE | Admit: 2016-10-31 | Discharge: 2016-10-31 | Disposition: A | Discharge status: Home / Self Care | Payer: BLUE CROSS/BLUE SHIELD | Source: Ambulatory Visit | Attending: Internal Medicine | Admitting: Internal Medicine

## 2016-10-31 DIAGNOSIS — D45 Polycythemia vera: Secondary | ICD-10-CM | POA: Diagnosis not present

## 2016-10-31 LAB — HEMOGLOBIN AND HEMATOCRIT, BLOOD
HEMATOCRIT: 47.1 % (ref 40.0–52.0)
HEMOGLOBIN: 15.6 g/dL (ref 13.0–18.0)

## 2016-11-14 ENCOUNTER — Ambulatory Visit
Admission: RE | Admit: 2016-11-14 | Discharge: 2016-11-14 | Disposition: A | Discharge status: Home / Self Care | Payer: BLUE CROSS/BLUE SHIELD | Source: Ambulatory Visit | Attending: Internal Medicine | Admitting: Internal Medicine

## 2017-03-24 ENCOUNTER — Telehealth: Payer: Self-pay | Admitting: *Deleted

## 2017-03-24 NOTE — Telephone Encounter (Signed)
Patient called and wanted to talk to you about his colonoscopy. He had one back in 2017 and is in recalls for 2020. Patient stated that he wanted to speak directly to you regarding the upcoming colonoscopy. I advised patient that you are out of town this whole week and will not be back in the office until next week. I gave him the option to talk with a nurse but he declined and wanted to wait until you get back.

## 2017-04-04 ENCOUNTER — Ambulatory Visit
Admission: RE | Admit: 2017-04-04 | Discharge: 2017-04-04 | Disposition: A | Discharge status: Home / Self Care | Payer: BLUE CROSS/BLUE SHIELD | Source: Ambulatory Visit | Attending: Internal Medicine | Admitting: Internal Medicine

## 2017-04-04 DIAGNOSIS — D751 Secondary polycythemia: Secondary | ICD-10-CM | POA: Insufficient documentation

## 2017-04-04 LAB — HEMOGLOBIN AND HEMATOCRIT, BLOOD
HCT: 50.2 % (ref 40.0–52.0)
Hemoglobin: 16.3 g/dL (ref 13.0–18.0)

## 2017-04-26 ENCOUNTER — Telehealth: Payer: Self-pay | Admitting: General Surgery

## 2017-04-26 NOTE — Telephone Encounter (Signed)
Returned patient's call. Left cell for call back.

## 2017-05-06 ENCOUNTER — Other Ambulatory Visit
Admission: RE | Admit: 2017-05-06 | Discharge: 2017-05-06 | Disposition: A | Discharge status: Home / Self Care | Payer: BLUE CROSS/BLUE SHIELD | Source: Ambulatory Visit | Attending: Internal Medicine | Admitting: Internal Medicine

## 2017-05-06 DIAGNOSIS — D751 Secondary polycythemia: Secondary | ICD-10-CM | POA: Insufficient documentation

## 2017-05-06 DIAGNOSIS — R079 Chest pain, unspecified: Secondary | ICD-10-CM | POA: Diagnosis present

## 2017-05-06 LAB — FIBRIN DERIVATIVES D-DIMER (ARMC ONLY): Fibrin derivatives D-dimer (ARMC): 264.59 ng/mL (FEU) (ref 0.00–499.00)

## 2017-05-06 LAB — TROPONIN I: Troponin I: <0.03 ng/mL (ref <0.03)

## 2017-10-17 ENCOUNTER — Ambulatory Visit
Admission: RE | Admit: 2017-10-17 | Discharge: 2017-10-17 | Disposition: A | Discharge status: Home / Self Care | Payer: BLUE CROSS/BLUE SHIELD | Source: Ambulatory Visit | Attending: Internal Medicine | Admitting: Internal Medicine

## 2017-10-17 DIAGNOSIS — D751 Secondary polycythemia: Secondary | ICD-10-CM | POA: Diagnosis present

## 2017-11-04 ENCOUNTER — Other Ambulatory Visit: Payer: Self-pay | Admitting: Internal Medicine

## 2017-11-04 DIAGNOSIS — R1084 Generalized abdominal pain: Secondary | ICD-10-CM

## 2017-11-10 ENCOUNTER — Ambulatory Visit
Admission: RE | Admit: 2017-11-10 | Discharge: 2017-11-10 | Disposition: A | Discharge status: Home / Self Care | Payer: BLUE CROSS/BLUE SHIELD | Source: Ambulatory Visit | Attending: Internal Medicine | Admitting: Internal Medicine

## 2017-11-10 DIAGNOSIS — R1084 Generalized abdominal pain: Secondary | ICD-10-CM | POA: Diagnosis not present

## 2017-11-11 ENCOUNTER — Other Ambulatory Visit: Payer: Self-pay | Admitting: Internal Medicine

## 2017-11-11 DIAGNOSIS — R1013 Epigastric pain: Secondary | ICD-10-CM

## 2017-11-28 ENCOUNTER — Ambulatory Visit
Admission: RE | Admit: 2017-11-28 | Discharge: 2017-11-28 | Disposition: A | Discharge status: Home / Self Care | Payer: BLUE CROSS/BLUE SHIELD | Source: Ambulatory Visit | Attending: Internal Medicine | Admitting: Internal Medicine

## 2017-11-28 DIAGNOSIS — R1013 Epigastric pain: Secondary | ICD-10-CM

## 2017-11-28 MED ORDER — TECHNETIUM TC 99M MEBROFENIN IV KIT
5.5010 | PACK | Freq: Once | INTRAVENOUS | Status: AC | PRN
  Administered 2017-11-28: 5.501 via INTRAVENOUS

## 2018-02-11 HISTORY — PX: OTHER SURGICAL HISTORY: SHX169

## 2018-05-12 ENCOUNTER — Other Ambulatory Visit: Payer: Self-pay

## 2018-05-12 ENCOUNTER — Ambulatory Visit
Admission: RE | Admit: 2018-05-12 | Discharge: 2018-05-12 | Disposition: A | Discharge status: Home / Self Care | Payer: BLUE CROSS/BLUE SHIELD | Source: Ambulatory Visit | Attending: Internal Medicine | Admitting: Internal Medicine

## 2018-05-12 DIAGNOSIS — D751 Secondary polycythemia: Secondary | ICD-10-CM | POA: Diagnosis not present

## 2018-05-12 LAB — HEMOGLOBIN AND HEMATOCRIT, BLOOD
HEMATOCRIT: 50.1 % (ref 39.0–52.0)
HEMOGLOBIN: 16.6 g/dL (ref 13.0–17.0)

## 2018-06-12 IMAGING — CT CT HEAD W/O CM
1 series · 16 of 30 positions shown, 20 images · non-contrast
Comparison: 11/18/2005

CLINICAL DATA: Headache for several weeks, initial encounter

EXAM:
CT HEAD WITHOUT CONTRAST
TECHNIQUE: Contiguous axial images were obtained from the base of the skull
through the vertex without intravenous contrast.

[Series 2: head wo · axial · 0.40mm/px · z∈[-122,+13]mm · 16 of 30 slices shown, 20 images]
[im 2/30  brain]
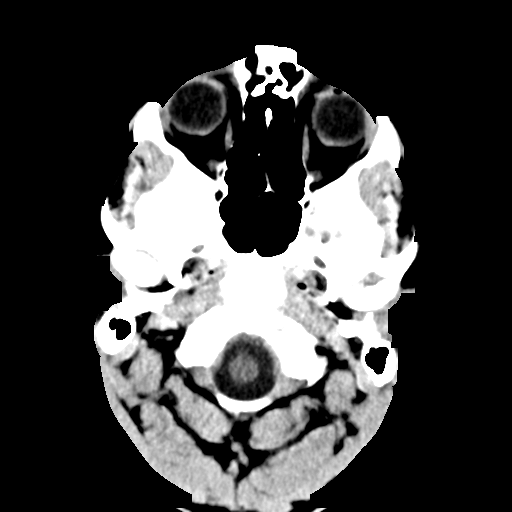
[im 2/30  bone]
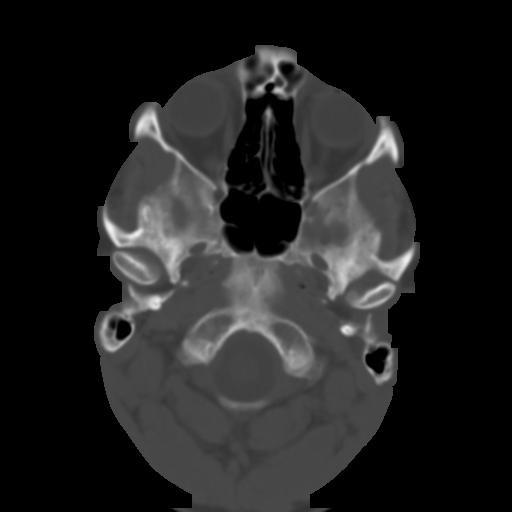
[im 4/30  brain]
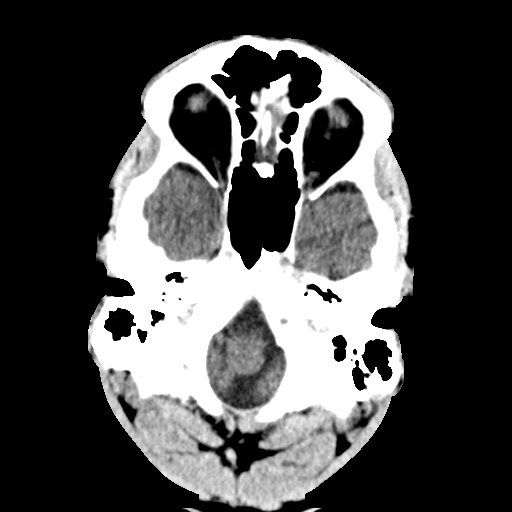
[im 6/30  brain]
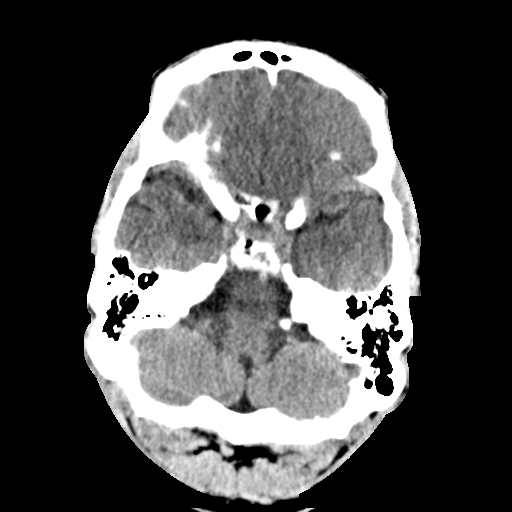
[im 8/30  brain]
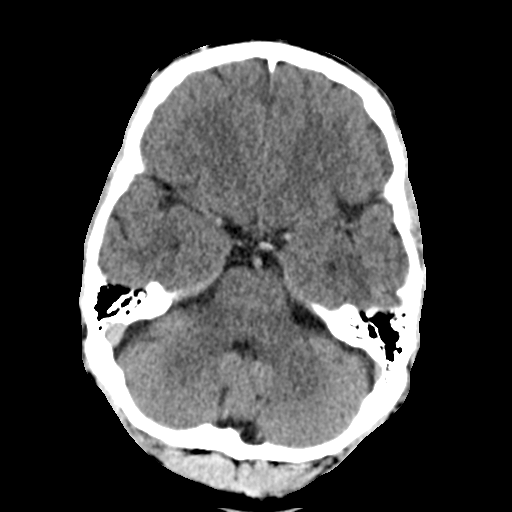
[im 9/30  brain]
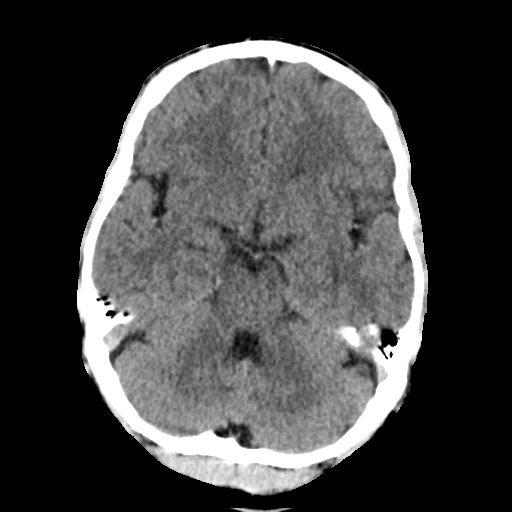
[im 9/30  bone]
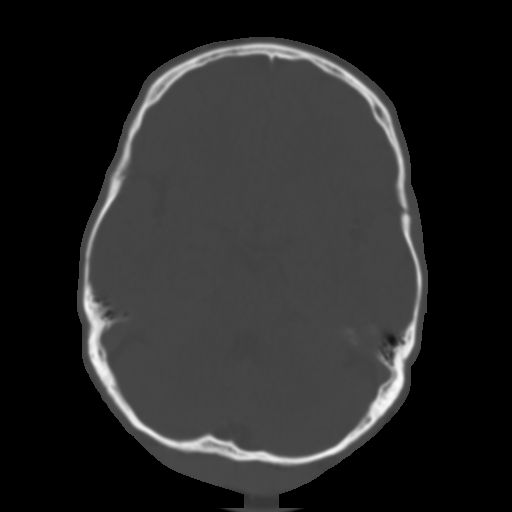
[im 11/30  brain]
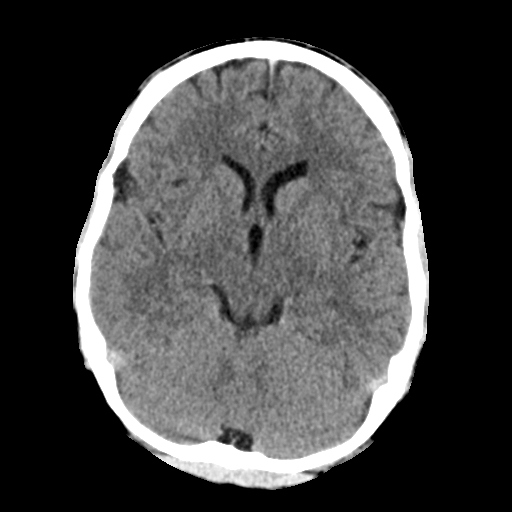
[im 13/30  brain]
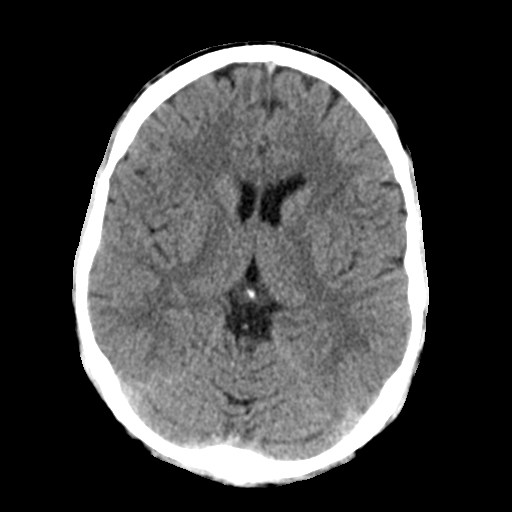
[im 15/30  brain]
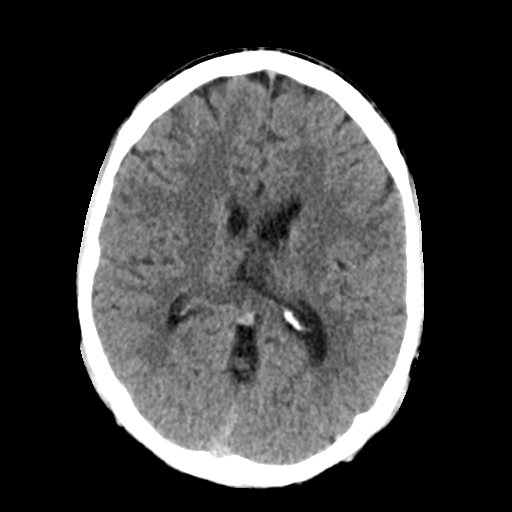
[im 16/30  brain]
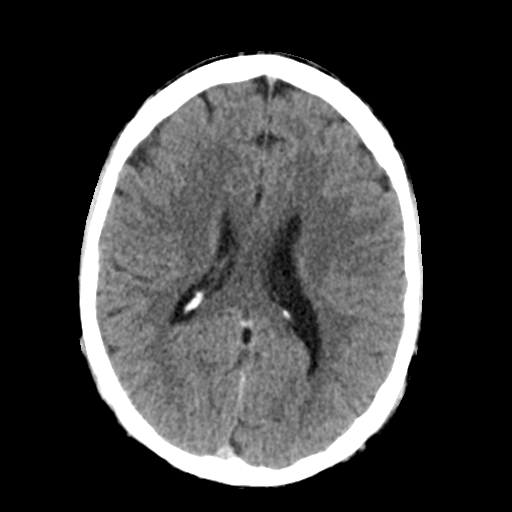
[im 16/30  bone]
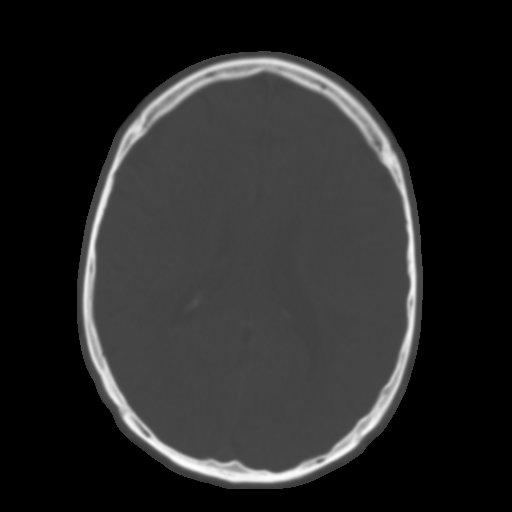
[im 18/30  brain]
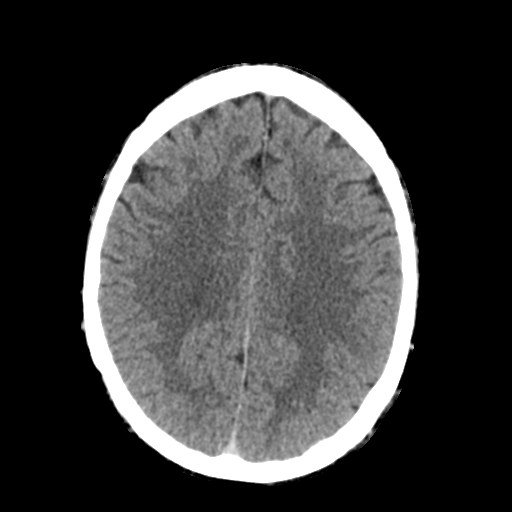
[im 20/30  brain]
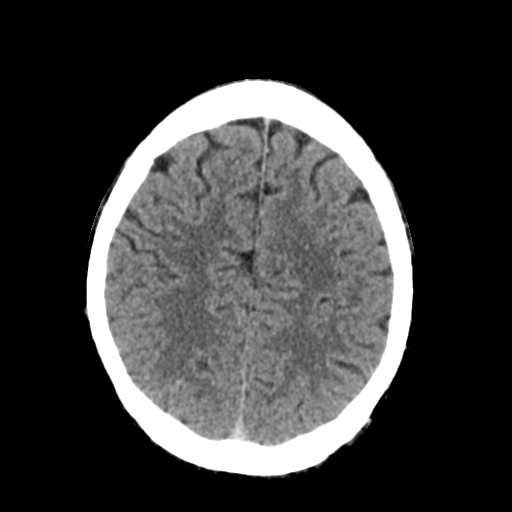
[im 22/30  brain]
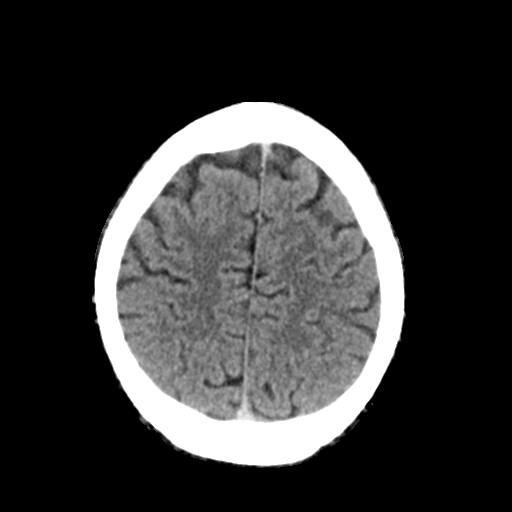
[im 23/30  brain]
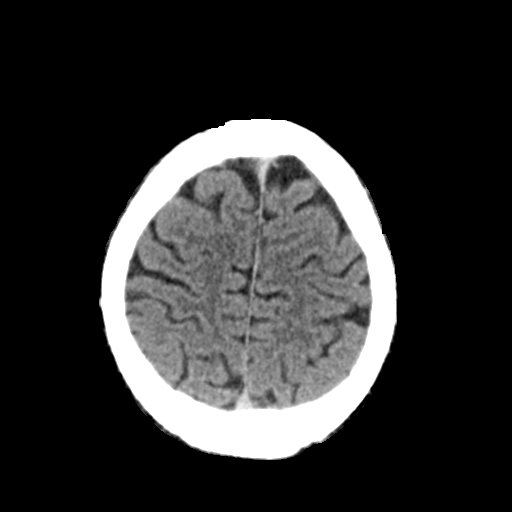
[im 23/30  bone]
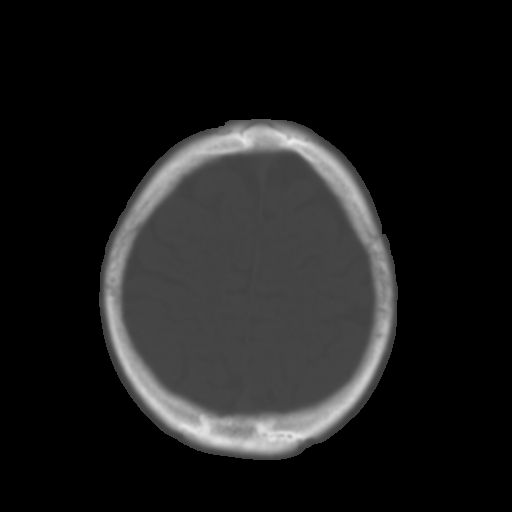
[im 25/30  brain]
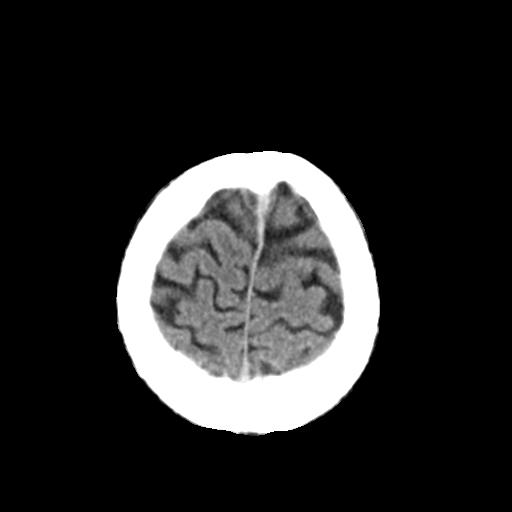
[im 27/30  brain]
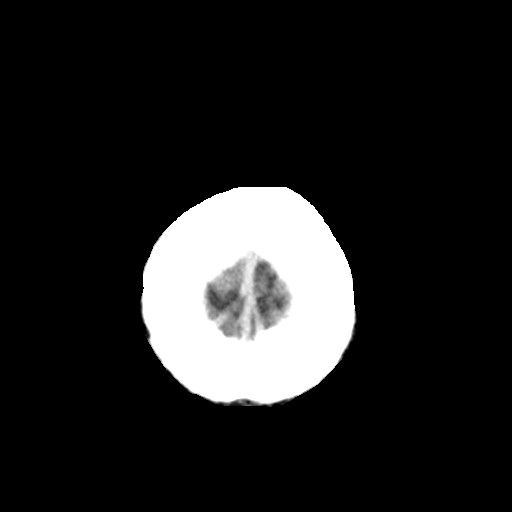
[im 29/30  brain]
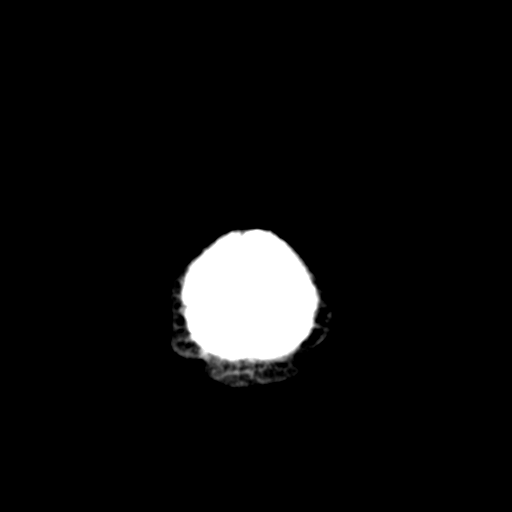

[16 of 30 positions shown; findings below may reference images not displayed]

FINDINGS: Brain: No evidence of acute infarction, hemorrhage, hydrocephalus,
extra-axial collection or mass lesion/mass effect.

Vascular: No hyperdense vessel or unexpected calcification.

Skull: Normal. Negative for fracture or focal lesion.

Sinuses/Orbits: No acute finding.
IMPRESSION: No acute intracranial abnormality noted.

## 2018-07-03 ENCOUNTER — Other Ambulatory Visit: Payer: Self-pay | Admitting: Sports Medicine

## 2018-07-03 ENCOUNTER — Other Ambulatory Visit (HOSPITAL_COMMUNITY): Payer: Self-pay | Admitting: Sports Medicine

## 2018-07-03 DIAGNOSIS — M25531 Pain in right wrist: Secondary | ICD-10-CM

## 2018-07-03 DIAGNOSIS — S60221D Contusion of right hand, subsequent encounter: Secondary | ICD-10-CM

## 2018-07-03 DIAGNOSIS — S63501D Unspecified sprain of right wrist, subsequent encounter: Secondary | ICD-10-CM

## 2018-07-10 ENCOUNTER — Ambulatory Visit (HOSPITAL_COMMUNITY)
Admission: RE | Admit: 2018-07-10 | Discharge: 2018-07-10 | Disposition: A | Discharge status: Home / Self Care | Payer: BLUE CROSS/BLUE SHIELD | Source: Ambulatory Visit | Attending: Sports Medicine | Admitting: Sports Medicine

## 2018-07-10 ENCOUNTER — Other Ambulatory Visit: Payer: Self-pay

## 2018-07-10 DIAGNOSIS — S63501D Unspecified sprain of right wrist, subsequent encounter: Secondary | ICD-10-CM

## 2018-07-10 DIAGNOSIS — S60221D Contusion of right hand, subsequent encounter: Secondary | ICD-10-CM | POA: Insufficient documentation

## 2018-07-10 DIAGNOSIS — M25531 Pain in right wrist: Secondary | ICD-10-CM

## 2018-07-10 MED ORDER — SODIUM CHLORIDE (PF) 0.9 % IJ SOLN
10.0000 mL | Freq: Once | INTRAMUSCULAR | Status: AC
  Administered 2018-07-10: 14:00:00 0.75 mL via INTRAVENOUS

## 2018-07-10 MED ORDER — GADOBENATE DIMEGLUMINE 529 MG/ML IV SOLN
5.0000 mL | Freq: Once | INTRAVENOUS | Status: AC | PRN
  Administered 2018-07-10: 0.05 mL via INTRA_ARTICULAR

## 2018-07-10 MED ORDER — SODIUM CHLORIDE (PF) 0.9 % IJ SOLN
INTRAMUSCULAR | Status: AC
  Administered 2018-07-10: 0.75 mL via INTRAVENOUS
  Filled 2018-07-10: qty 10

## 2018-07-10 MED ORDER — LIDOCAINE HCL (PF) 1 % IJ SOLN
INTRAMUSCULAR | Status: AC
  Administered 2018-07-10: 2 mL via INTRADERMAL
  Filled 2018-07-10: qty 5

## 2018-07-10 MED ORDER — IOPAMIDOL (ISOVUE-M 200) INJECTION 41%
INTRAMUSCULAR | Status: DC
Start: 2018-07-10 — End: 2018-07-11
  Filled 2018-07-10: qty 10

## 2018-07-10 MED ORDER — IOPAMIDOL (ISOVUE-M 200) INJECTION 41%
20.0000 mL | Freq: Once | INTRAMUSCULAR | Status: AC
  Administered 2018-07-10: 0.75 mL via INTRA_ARTICULAR

## 2018-07-10 MED ORDER — LIDOCAINE HCL (PF) 1 % IJ SOLN
5.0000 mL | Freq: Once | INTRAMUSCULAR | Status: AC
  Administered 2018-07-10: 2 mL via INTRADERMAL

## 2018-07-10 NOTE — Procedures (Signed)
Right wrist arthrogram was performed, injecting 1.5 mL of contrast mixture with good opacification of the radiocarpal joint. No immediate complication. Patient was subsequently taken for MRI.

## 2018-07-20 ENCOUNTER — Ambulatory Visit: Payer: BC Managed Care – PPO | Attending: Sports Medicine | Admitting: Occupational Therapy

## 2018-07-20 ENCOUNTER — Other Ambulatory Visit: Payer: Self-pay

## 2018-07-20 ENCOUNTER — Encounter: Payer: Self-pay | Admitting: Occupational Therapy

## 2018-07-20 DIAGNOSIS — M25531 Pain in right wrist: Secondary | ICD-10-CM

## 2018-07-20 DIAGNOSIS — M6281 Muscle weakness (generalized): Secondary | ICD-10-CM | POA: Diagnosis present

## 2018-07-20 DIAGNOSIS — M25631 Stiffness of right wrist, not elsewhere classified: Secondary | ICD-10-CM | POA: Diagnosis present

## 2018-07-20 NOTE — Patient Instructions (Signed)
Heat  AROM for wrist flexion ,ext, RD, UD - pain free  10-15 reps   3 x day  Wrist splint on at all times during day - off only with 3 x day HEP  And can try and sleep without it   Ice at end if needed
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
## 2018-07-23 ENCOUNTER — Other Ambulatory Visit: Payer: Self-pay

## 2018-07-23 ENCOUNTER — Ambulatory Visit: Payer: BC Managed Care – PPO | Admitting: Occupational Therapy

## 2018-07-23 DIAGNOSIS — M25631 Stiffness of right wrist, not elsewhere classified: Secondary | ICD-10-CM

## 2018-07-23 DIAGNOSIS — M6281 Muscle weakness (generalized): Secondary | ICD-10-CM

## 2018-07-23 DIAGNOSIS — M25531 Pain in right wrist: Secondary | ICD-10-CM | POA: Diagnosis not present
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
## 2018-07-23 NOTE — Patient Instructions (Addendum)
Add to HEP composite wrist flexion with hand in fist 10 reps  keep pain under 2/10

## 2018-07-27 ENCOUNTER — Ambulatory Visit: Payer: BC Managed Care – PPO | Admitting: Occupational Therapy

## 2018-07-27 ENCOUNTER — Other Ambulatory Visit: Payer: Self-pay

## 2018-07-27 DIAGNOSIS — M25531 Pain in right wrist: Secondary | ICD-10-CM

## 2018-07-27 DIAGNOSIS — M6281 Muscle weakness (generalized): Secondary | ICD-10-CM

## 2018-07-27 DIAGNOSIS — M25631 Stiffness of right wrist, not elsewhere classified: Secondary | ICD-10-CM

## 2018-07-27 NOTE — Therapy (Signed)
[facility] [address] [place], [address] Phone: [phone]   Fax:  [phone]  Occupational Therapy Treatment  Patient Details  Name: John Davis MRN: [id number] Date of Birth: May 04, 1985 Referring Provider (OT): Candelaria Stagers   Encounter Date: 07/27/2018  OT End of Session - 07/27/18 1537    Visit Number  3    Number of Visits  12    Date for OT Re-Evaluation  08/31/18    OT Start Time  1502    OT Stop Time  1533    OT Time Calculation (min)  31 min    Activity Tolerance  Patient tolerated treatment well    Behavior During Therapy  [facility] for tasks assessed/performed       Past Medical History:  Diagnosis Date  . Polycythemia 2017   high hemoglobin  . PTSD (post-traumatic stress disorder)   . Vitamin B12 deficiency 11/08/2015    Past Surgical History:  Procedure Laterality Date  . COLONOSCOPY W/ POLYPECTOMY  [phone]   Dr. Bary Castilla  . COLONOSCOPY WITH PROPOFOL N/A 12/26/2015   Procedure: COLONOSCOPY WITH PROPOFOL;  Surgeon: Robert Bellow, MD;  Location: [facility] ENDOSCOPY;  Service: Endoscopy;  Laterality: N/A;    There were no vitals filed for this visit.  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  Yes    Pain Score  4     Pain Location  Wrist    Pain Orientation  Right    Pain Descriptors / Indicators  Aching    Pain Type  Acute pain    Aggravating Factors   twisting , range of motion end range         [facility] OT Assessment - 07/27/18 0001      AROM   Right Wrist Extension  74 Degrees    Right Wrist Flexion  76 Degrees   56 and after fluido 74   Right Wrist Radial Deviation  24 Degrees    Right Wrist Ulnar Deviation  34 Degrees        AROM for R wrist in all planes assess - and composite flexion - great progress compare to last time  - see flowsheet   no pain with 2nd and 3rd digit extention and resistance  But increase pull with wrist extention using extended digits         OT Treatments/Exercises (OP) - 07/27/18 0001  RUE Fluidotherapy   Number Minutes Fluidotherapy  8 Minutes    RUE Fluidotherapy Location  Hand;Wrist    Comments  AROM in all planes  prior to soft tissue and ROM        Done some graston tool nr 2 for sweeping over volar , dorsal and radial forearm prior to ROM  Review with pt HEP for AROM for wrist in all planes  Done some gentle joint mobs to wrist- less pain on lateral -  And less tenderness with palpation  Resistance to UD, RD, flexion pain free- strain felt with extention - NO isometric yet    pt to cont with  composite fist to stretch and extention loose fist  and separate open hand wrist flexion , ext  Cont with RD, UD, sup , pron  HEP pain to be less than 2/10  And cont splint wearing  Except ADL's and HEP           OT Education - 07/27/18 1537    Education Details  progress and cont HEP    Person(s) Educated  Patient    Methods  Explanation;Demonstration;Tactile cues;Handout    Comprehension  Verbalized understanding;Returned demonstration       OT Short Term Goals - 07/20/18 1203      OT SHORT  TERM GOAL #1   Title  Pt to be independent in splint use and HEP to increase AROM while decreasing pain     Baseline  little knowledge     Time  2    Period  Weeks    Status  New    Target Date  08/03/18      OT SHORT TERM GOAL #2   Title  Pain on PRHWE improve with more than 15 points to initiate strenghtening and weaning out of splint     Baseline  at eval PRWHE pain 18/50 - pain with composite fist flexion , wrist extention 5/10    Time  3    Period  Weeks    Status  New    Target Date  08/10/18        OT Long Term Goals - 07/20/18 1206      OT LONG TERM GOAL #1   Title  R wrist strength improve to 5/5 at end range pain free to carry, pull and push less than 10 lbs objects     Baseline  pain at 52 wrist extention , 64 flexion     Time  6    Period  Weeks    Status  New    Target Date  08/31/18      OT LONG TERM GOAL #2   Title  Function score on PRWHE improve with more 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    Baseline  Function score on PRWHE at eval 18/50 -     Time  6    Period  Weeks    Status  New    Target Date  08/31/18            Plan - 07/27/18 1538    Clinical Impression Statement  Pt is about 10 wks from injury to wrist dorsal scaphotriquetral ligament - sprain - pt showed increase composite flexion of wrist this date compare to last week -report little more ache with end range today with cooler and wet weather - but decrease pain with palpation and joint mobs - cont with same HEP until next session    OT  Occupational Profile and History  Detailed Assessment- Review of Records and additional review of physical, cognitive, psychosocial history related to current functional performance    Occupational performance deficits (Please refer to evaluation for details):  ADL's;Play;Leisure;IADL's;Social Participation;Work    Marketing executive / Function / Physical Skills  ADL;Flexibility;ROM;Strength;Edema;IADL;Pain;UE functional use;Decreased knowledge  of precautions    Clinical Decision Making  Several treatment options, min-mod task modification necessary    Modification or Assistance to Complete Evaluation   Min-Moderate modification of tasks or assist with assess necessary to complete eval    OT Frequency  --   1-2 x wk   OT Duration  --   5 wks   OT Treatment/Interventions  Self-care/ADL training;Cryotherapy;Moist Heat;Fluidtherapy;Ultrasound;Passive range of motion;Patient/family education;Splinting;Manual Therapy;Therapeutic exercise    Plan  assess progress with HEP and splinting     OT Home Exercise Plan  see pt instruction    Consulted and Agree with Plan of Care  Patient       Patient will benefit from skilled therapeutic intervention in order to improve the following deficits and impairments:  Body Structure / Function / Physical Skills  Visit Diagnosis: Stiffness of right wrist, not elsewhere classified  Pain in right wrist  Muscle weakness (generalized)    Problem List Patient Active Problem List   Diagnosis Date Noted  . Allergic state 11/29/2015  . Colon polyps 11/29/2015  . Polycythemia 11/08/2015  . B12 deficiency 11/08/2015  . Blood pressure elevated without history of HTN 11/02/2015  . Headache 11/02/2015  . History of colonic polyps 10/27/2015    Rosalyn Gess OTR/L,CLT 07/27/2018, 3:43 PM  [facility] [address], [place], [address] Phone: [phone]   Fax:  [phone]  Name: John Davis MRN: [id number] Date of Birth: 06-28-1985

## 2018-07-27 NOTE — Patient Instructions (Signed)
Same HEP  

## 2018-07-29 ENCOUNTER — Ambulatory Visit: Payer: BC Managed Care – PPO | Admitting: Occupational Therapy

## 2018-07-29 ENCOUNTER — Other Ambulatory Visit: Payer: Self-pay

## 2018-07-29 DIAGNOSIS — M6281 Muscle weakness (generalized): Secondary | ICD-10-CM

## 2018-07-29 DIAGNOSIS — M25631 Stiffness of right wrist, not elsewhere classified: Secondary | ICD-10-CM

## 2018-07-29 DIAGNOSIS — M25531 Pain in right wrist: Secondary | ICD-10-CM | POA: Diagnosis not present

## 2018-07-29 NOTE — Therapy (Signed)
[facility] [address], [place], [address] Phone: [phone]   Fax:  [phone]  Occupational Therapy Treatment  Patient Details  Name: John Davis MRN: [id number] Date of Birth: May 29, 1985 Referring Provider (OT): Candelaria Stagers   Encounter Date: 07/29/2018  OT End of Session - 07/29/18 1543    Visit Number  4    Number of Visits  12    Date for OT Re-Evaluation  08/31/18    OT Start Time  1455    OT Stop Time  1526    OT Time Calculation (min)  31 min    Activity Tolerance  Patient tolerated treatment well    Behavior During Therapy  [facility] for tasks assessed/performed       Past Medical History:  Diagnosis Date  . Polycythemia 2017   high hemoglobin  . PTSD (post-traumatic stress disorder)   . Vitamin B12 deficiency 11/08/2015    Past Surgical History:  Procedure Laterality Date  . COLONOSCOPY W/ POLYPECTOMY  [phone]   Dr. Bary Castilla  . COLONOSCOPY WITH PROPOFOL N/A 12/26/2015   Procedure: COLONOSCOPY WITH PROPOFOL;  Surgeon: Robert Bellow, MD;  Location: [facility] ENDOSCOPY;  Service: Endoscopy;  Laterality: N/A;    There were no vitals filed for this visit.  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  Yes    Pain Score  2     Pain Location  Wrist    Pain Orientation  Right    Pain Descriptors / Indicators  Aching    Pain Type  Acute pain    Pain Onset  More than a month ago    Pain Frequency  Intermittent         [facility] OT Assessment - 07/29/18 0001      AROM   Right Wrist Extension  76 Degrees    Right Wrist Flexion  78 Degrees   after AAROM /fluido 85 - composite 65      assess AROM for wrist flexion, extention AROM - and resistance in all planes   pain free          OT Treatments/Exercises (OP) - 07/29/18 0001      RUE Fluidotherapy   Number Minutes Fluidotherapy  8 Minutes    RUE Fluidotherapy Location  Hand;Wrist    Comments  AROM in all planes - focus flexion/extention        Done some graston tool nr 2 for sweeping over volar ,  dorsal and radial forearm prior to ROM   Done some gentle joint mobs to wrist- less pain on lateral  And less tenderness with palpation  Resistance to UD, RD, flexion. extention this date         Add  And review AAROM for wrist flexion and extention but no WB and keep slight pull less than 2/10  10 reps hold 5 sec  Can do prior to AROM and after contrast And then isometric strengthening 10 reps wrist flexion, ext, RD, UD neutral position Had no pain  Can increase to 2 sets in 3 days if pain free and no increase pain  Cont splint 80% of time    OT Education - 07/29/18 1543    Education Details  progress and changes to  HEP    Person(s) Educated  Patient    Methods  Explanation;Demonstration;Tactile cues;Handout    Comprehension  Verbalized understanding;Returned demonstration       OT Short Term Goals - 07/20/18 1203      OT SHORT TERM GOAL #1   Title  Pt to be independent in splint use and HEP to increase AROM while decreasing pain     Baseline  little knowledge     Time  2    Period  Weeks    Status  New    Target Date  08/03/18      OT  SHORT TERM GOAL #2   Title  Pain on PRHWE improve with more than 15 points to initiate strenghtening and weaning out of splint     Baseline  at eval PRWHE pain 18/50 - pain with composite fist flexion , wrist extention 5/10    Time  3    Period  Weeks    Status  New    Target Date  08/10/18        OT Long Term Goals - 07/20/18 1206      OT LONG TERM GOAL #1   Title  R wrist strength improve to 5/5 at end range pain free to carry, pull and push less than 10 lbs objects     Baseline  pain at 52 wrist extention , 64 flexion     Time  6    Period  Weeks    Status  New    Target Date  08/31/18      OT LONG TERM GOAL #2   Title  Function score on PRWHE improve with more 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    Baseline  Function score on PRWHE at eval 18/50 -     Time  6    Period  Weeks    Status  New    Target Date  08/31/18            Plan - 07/29/18 1544    Clinical Impression Statement  Pt is about 10 1/2 wks from injury to wrist dorsal scaphotriquetral ligament - sprain - pt show increase AROM with decrease pain - still wearing prefab wrist splint - add this date AAROM for wrist flexion and extention as well as isometric strenghtening in all planes but neutral position pain free    OT Occupational Profile and History  Detailed Assessment- Review of Records and additional review of physical, cognitive, psychosocial history related to current functional performance    Occupational performance deficits (Please refer to evaluation for details):  ADL's;Play;Leisure;IADL's;Social Participation;Work    Marketing executive / Function / Physical Skills  ADL;Flexibility;ROM;Strength;Edema;IADL;Pain;UE functional use;Decreased  knowledge of precautions    Rehab Potential  Good    Clinical Decision Making  Several treatment options, min-mod task modification necessary    Modification or Assistance to Complete Evaluation   Min-Moderate modification of tasks or assist with  assess necessary to complete eval    OT Frequency  --   1-2 x wk   OT Duration  4 weeks    OT Treatment/Interventions  Self-care/ADL training;Cryotherapy;Moist Heat;Fluidtherapy;Ultrasound;Passive range of motion;Patient/family education;Splinting;Manual Therapy;Therapeutic exercise    Plan  assess progress with changes to HEP and splinting    OT Home Exercise Plan  see pt instruction    Consulted and Agree with Plan of Care  Patient       Patient will benefit from skilled therapeutic intervention in order to improve the following deficits and impairments:   Body Structure / Function / Physical Skills: ADL, Flexibility, ROM, Strength, Edema, IADL, Pain, UE functional use, Decreased knowledge of precautions       Visit Diagnosis: 1. Pain in right wrist   2. Muscle weakness (generalized)   3. Stiffness of right wrist, not elsewhere classified       Problem List Patient Active Problem List   Diagnosis Date Noted  . Allergic state 11/29/2015  . Colon polyps 11/29/2015  . Polycythemia 11/08/2015  . B12 deficiency 11/08/2015  . Blood pressure elevated without history of HTN 11/02/2015  . Headache 11/02/2015  . History of colonic polyps 10/27/2015    Rosalyn Gess OTR/L,CLT 07/29/2018, 3:48 PM  [facility] [address], [place], [address] Phone: [phone]   Fax:  [phone]  Name: John Davis MRN: [id number] Date of Birth: [birth year]

## 2018-07-29 NOTE — Patient Instructions (Signed)
Add AAROM for wrist flexion and extention but no WB and keep slight pull less than 2/10  10 reps hold 5 sec  Prior to AROM and after contrast And then isometric strengthening 10 reps wrist flexion, ext, RD, UD neutral position Can increase to 2 sets in 3 days if pain free and no increase pain  Cont splint 80% of time

## 2018-08-04 ENCOUNTER — Ambulatory Visit: Payer: BC Managed Care – PPO | Admitting: Occupational Therapy

## 2018-08-04 ENCOUNTER — Other Ambulatory Visit: Payer: Self-pay

## 2018-08-04 DIAGNOSIS — M25631 Stiffness of right wrist, not elsewhere classified: Secondary | ICD-10-CM

## 2018-08-04 DIAGNOSIS — M6281 Muscle weakness (generalized): Secondary | ICD-10-CM

## 2018-08-04 DIAGNOSIS — M25531 Pain in right wrist: Secondary | ICD-10-CM

## 2018-08-04 NOTE — Patient Instructions (Signed)
Add light weight bearing thru palm on table - table slides - pain free Isometric strength for wrist ext, flexion , RD, UD end range  10 reps  Can do tomorrow 2 sets of 12 for strengthening  And table slide increase to 20 reps  Pain free

## 2018-08-04 NOTE — Therapy (Signed)
[facility] [address], [place], [address] Phone: [phone]   Fax:  [phone]  Occupational Therapy Treatment  Patient Details  Name: John Davis MRN: [id number] Date of Birth: Jul 24, 1985 Referring Provider (OT): Candelaria Stagers   Encounter Date: 08/04/2018  OT End of Session - 08/04/18 1425    Visit Number  5    Number of Visits  12    Date for OT Re-Evaluation  08/31/18    OT Start Time  1010    OT Stop Time  1038    OT Time Calculation (min)  28 min    Activity Tolerance  Patient tolerated treatment well    Behavior During Therapy  [facility] for tasks assessed/performed       Past Medical History:  Diagnosis Date  . Polycythemia 2017   high hemoglobin  . PTSD (post-traumatic stress disorder)   . Vitamin B12 deficiency 11/08/2015    Past Surgical History:  Procedure Laterality Date  . COLONOSCOPY W/ POLYPECTOMY  [phone]   Dr. Bary Castilla  . COLONOSCOPY WITH PROPOFOL N/A 12/26/2015   Procedure: COLONOSCOPY WITH PROPOFOL;  Surgeon: Robert Bellow, MD;  Location: [facility] ENDOSCOPY;  Service: Endoscopy;  Laterality: N/A;    There were no vitals filed for this visit.  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  No/denies         [facility] OT Assessment - 08/04/18 0001      AROM   Right Wrist Extension  85 Degrees    Right Wrist Flexion  85 Degrees   75 with fist      Strength   Right Hand Grip (lbs)  125    Right Hand Lateral Pinch  26 lbs    Right Hand 3 Point Pinch  29 lbs    Left Hand Grip (lbs)  121    Left Hand Lateral Pinch  24 lbs    Left Hand 3 Point Pinch  26 lbs      Assess AROM for R wrist - in all planes And strength - no pain with resistance   Gentle AAROM for wrist flexion with hand in fist Done by OT  Table slides done and add to HEP - pain free and light weight 12 reps pain free  can increase to 20 reps tomorrow End range  isometric strengthening - for wrist flexion, ext, RD, UD  10 reps  2 sets tomorrow if pain free  Can wean 50% of time into soft  Wrist splint during day          OT Treatments/Exercises (OP) - 08/04/18 0001      RUE Fluidotherapy   Number Minutes Fluidotherapy  8 Minutes    RUE Fluidotherapy Location  Hand;Wrist    Comments  AROM for wrist in all planes - focus on composite fist              OT Education - 08/04/18 1424    Education Details  progress and changes to  HEP    Person(s) Educated  Patient    Methods  Explanation;Demonstration;Tactile cues;Handout    Comprehension  Verbalized understanding;Returned demonstration       OT Short Term Goals - 07/20/18 1203      OT SHORT TERM GOAL #1   Title  Pt to be independent in splint use and HEP to increase AROM while decreasing pain     Baseline  little knowledge     Time  2    Period  Weeks    Status  New    Target Date  08/03/18      OT SHORT TERM GOAL #2   Title  Pain on PRHWE improve with more than 15 points to initiate strenghtening and weaning out of splint     Baseline  at eval PRWHE pain 18/50 -  pain with composite fist flexion , wrist extention 5/10    Time  3    Period  Weeks    Status  New    Target Date  08/10/18        OT Long Term Goals - 07/20/18 1206      OT LONG TERM GOAL #1   Title  R wrist strength improve to 5/5 at end range pain free to carry, pull and push less than 10 lbs objects     Baseline  pain at 52 wrist extention , 64 flexion     Time  6    Period  Weeks    Status  New    Target Date  08/31/18      OT LONG TERM GOAL #2   Title  Function score on PRWHE improve with more 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    Baseline  Function score on PRWHE at eval 18/50 -     Time  6    Period  Weeks    Status  New    Target Date  08/31/18            Plan - 08/04/18 1430    Clinical Impression Statement  Pt is about 11 wks from injury to wrist dorsal scaphotriquetral ligament - sprain - pt show increase AROM , decrease pain - cont to feel pull with composite flexion - was able to initiate table slides and end range isometric strengthening    OT Occupational Profile and History  Detailed Assessment- Review of Records and additional review of physical, cognitive, psychosocial history related to current functional performance    Occupational performance deficits (Please refer to evaluation for details):  ADL's;Play;Leisure;IADL's;Social Participation;Work    Marketing executive / Function / Physical Skills  ADL;Flexibility;ROM;Strength;Edema;IADL;Pain;UE functional use;Decreased knowledge of precautions    Rehab Potential  Good    Clinical Decision Making  Several treatment options, min-mod task modification necessary    Modification or Assistance  to Complete Evaluation   Min-Moderate modification of tasks or assist with assess necessary to complete eval    OT Frequency  2x / week    OT Duration  4 weeks    OT Treatment/Interventions  Self-care/ADL training;Cryotherapy;Moist Heat;Fluidtherapy;Ultrasound;Passive range of motion;Patient/family  education;Splinting;Manual Therapy;Therapeutic exercise    Plan  assess progress with changes to HEP and splinting    OT Home Exercise Plan  see pt instruction    Consulted and Agree with Plan of Care  Patient       Patient will benefit from skilled therapeutic intervention in order to improve the following deficits and impairments:   Body Structure / Function / Physical Skills: ADL, Flexibility, ROM, Strength, Edema, IADL, Pain, UE functional use, Decreased knowledge of precautions       Visit Diagnosis: 1. Pain in right wrist   2. Muscle weakness (generalized)   3. Stiffness of right wrist, not elsewhere classified       Problem List Patient Active Problem List   Diagnosis Date Noted  . Allergic state 11/29/2015  . Colon polyps 11/29/2015  . Polycythemia 11/08/2015  . B12 deficiency 11/08/2015  . Blood pressure elevated without history of HTN 11/02/2015  . Headache 11/02/2015  . History of colonic polyps 10/27/2015    Rosalyn Gess OTR/L,CLT 08/04/2018, 2:40 PM  [place] [facility] [address], [place], [address] Phone: [phone]   Fax:  [phone]  Name: John Davis MRN: [id number] Date of Birth: [birth year]

## 2018-08-06 ENCOUNTER — Ambulatory Visit: Payer: BC Managed Care – PPO | Admitting: Occupational Therapy

## 2018-08-06 ENCOUNTER — Other Ambulatory Visit: Payer: Self-pay

## 2018-08-06 DIAGNOSIS — M25531 Pain in right wrist: Secondary | ICD-10-CM

## 2018-08-06 DIAGNOSIS — M25631 Stiffness of right wrist, not elsewhere classified: Secondary | ICD-10-CM

## 2018-08-06 DIAGNOSIS — M6281 Muscle weakness (generalized): Secondary | ICD-10-CM

## 2018-08-06 NOTE — Therapy (Signed)
[place] [facility] [address], [place], [address] Phone: [phone]   Fax:  [phone]  Occupational Therapy Treatment  Patient Details  Name: John Davis MRN: [id number] Date of Birth: September 12, 1985 Referring Provider (OT): Candelaria Stagers   Encounter Date: 08/06/2018  OT End of Session - 08/06/18 1356    Visit Number  6    Number of Visits  12    Date for OT Re-Evaluation  08/31/18    OT Start Time  1300    OT Stop Time  1336    OT Time Calculation (min)  36 min    Activity Tolerance  Patient tolerated treatment well    Behavior During Therapy  [facility] for tasks assessed/performed       Past Medical History:  Diagnosis Date  . Polycythemia 2017   high hemoglobin  . PTSD (post-traumatic stress disorder)   . Vitamin B12 deficiency 11/08/2015    Past Surgical History:  Procedure Laterality Date  . COLONOSCOPY W/ POLYPECTOMY  [phone]   Dr. Bary Castilla  . COLONOSCOPY WITH PROPOFOL N/A 12/26/2015   Procedure: COLONOSCOPY WITH PROPOFOL;  Surgeon: Robert Bellow, MD;  Location[facility] ENDOSCOPY;  Service: Endoscopy;  Laterality: N/A;    There were no vitals filed for this visit.  Subjective Assessment - 08/06/18 1352    Subjective   Did okay - took my splint on and off 2 hrs with wrap on , did table slides but did not push it hard -and done okay with the others - can I go scuba dive this weekend and I need to fix my fence - can I hold with the R and hammer with the L    Pertinent History  [place] - was in soft cast for 2 wks ,then wrist splint another 2 wks but done some AROM during day - -then the last 2 wks were in wrist splint that I bought at [employer] and  wear it about 70% of time - sleeping - mostly with act that wiill be hard on my wrist - I tried Research officer, political party and my wrist hurt - I cannot weight bear - 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  No/denies         [facility] OT Assessment - 08/06/18 0001      AROM   Right Wrist Extension  75 Degrees    Right Wrist Flexion  85 Degrees   75 in session with loose fist              OT Treatments/Exercises (OP) - 08/06/18 0001      RUE Fluidotherapy   Number Minutes Fluidotherapy  8 Minutes    RUE Fluidotherapy Location  Hand;Wrist    Comments  AROM for wrist flexion composite and open hand         Assess AROM for R wrist - in all planes And strength - no pain with resistance  Joint mobs no pain -  Gentle AAROM for wrist flexion with hand in fist and graston tool nr  2 sweeping over dorsal forearm  Done by OT  Cont with wrist flexion stretch - composite and UD add this date   Prayer stretch done - some pull -  Carpal rolls done by OT in semi weight bearing position - 2 x 10 reps  Less pain with prayer stretch  Repeat wall pushup again 10 reps  And add to HEP 2 sets of 10 reps pain free    Upgrade to 1 lbs to wrist in all planes - 2 sets to 10  Upgrade to 3 sets pain free over weekend  Can wean 4hrs off splint , 2 hrs on during day and wrap on with act                          OT Education - 08/06/18 1356    Education Details  progress and changes to  HEP    Person(s) Educated  Patient    Methods  Explanation;Demonstration;Tactile cues;Handout    Comprehension  Verbalized understanding;Returned demonstration       OT Short Term Goals - 07/20/18 1203      OT SHORT TERM GOAL #1   Title  Pt to be independent in splint use and HEP to increase AROM while decreasing pain     Baseline  little knowledge     Time  2    Period  Weeks    Status  New    Target Date  08/03/18      OT  SHORT TERM GOAL #2   Title  Pain on PRHWE improve with more than 15 points to initiate strenghtening and weaning out of splint     Baseline  at eval PRWHE pain 18/50 - pain with composite fist flexion , wrist extention 5/10    Time  3    Period  Weeks    Status  New    Target Date  08/10/18        OT Long Term Goals - 07/20/18 1206      OT LONG TERM GOAL #1   Title  R wrist strength improve to 5/5 at end range pain free to carry, pull and push less than 10 lbs objects     Baseline  pain at 52 wrist extention , 64 flexion     Time  6    Period  Weeks    Status  New    Target Date  08/31/18      OT LONG TERM GOAL #2   Title  Function score on PRWHE improve with more 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    Baseline  Function score on PRWHE at eval 18/50 -     Time  6    Period  Weeks    Status  New    Target Date  08/31/18            Plan - 08/06/18 1357    Clinical Impression Statement  Pt is about 11 1/2 wks from injury to wrist dorsal scaphotriquetral ligament sprain - pt show decrease pain , increase ROM and strength -able to initiate wall pushup - and weaning slowly out of splint    OT Occupational Profile and History  Detailed Assessment- Review of Records and additional review of physical, cognitive, psychosocial history related to current functional performance    Occupational performance deficits (Please refer to evaluation for details):  ADL's;Play;Leisure;IADL's;Social Participation;Work    Marketing executive / Function / Physical  Skills  ADL;Flexibility;ROM;Strength;Edema;IADL;Pain;UE functional use;Decreased knowledge of precautions    Rehab Potential  Good    Clinical Decision Making  Several treatment options, min-mod task modification necessary    Modification or Assistance to Complete Evaluation   Min-Moderate modification of tasks or assist with assess necessary to complete eval    OT Frequency  2x / week    OT Duration  4 weeks    OT  Treatment/Interventions  Self-care/ADL training;Cryotherapy;Moist Heat;Fluidtherapy;Ultrasound;Passive range of motion;Patient/family education;Splinting;Manual Therapy;Therapeutic exercise    Plan  assess progress with changes to HEP and splinting    OT Home Exercise Plan  see pt instruction    Consulted and Agree with Plan of Care  Patient       Patient will benefit from skilled therapeutic intervention in order to improve the following deficits and impairments:   Body Structure / Function / Physical Skills: ADL, Flexibility, ROM, Strength, Edema, IADL, Pain, UE functional use, Decreased knowledge of precautions       Visit Diagnosis: 1. Pain in right wrist   2. Muscle weakness (generalized)   3. Stiffness of right wrist, not elsewhere classified       Problem List Patient Active Problem List   Diagnosis Date Noted  . Allergic state 11/29/2015  . Colon polyps 11/29/2015  . Polycythemia 11/08/2015  . B12 deficiency 11/08/2015  . Blood pressure elevated without history of HTN 11/02/2015  . Headache 11/02/2015  . History of colonic polyps 10/27/2015    Rosalyn Gess OTR/L,CLT 08/06/2018, 2:00 PM  [place] [facility] [address], [place], [address] Phone: [phone]   Fax:  [phone]  Name: John Davis MRN: [id number] Date of Birth: February 16, 1985

## 2018-08-06 NOTE — Patient Instructions (Signed)
Upgrade to cont composite flexion stretch , add UD stretch And prayer stretch  Prior to wall pushup - 2x10 reps pain free -can increase to 3 sets over weekend if no increase pain   Upgrade to 1 lbs to wrist in all planes - 2 sets to 10  Upgrade to 3 sets pain free over weekend

## 2018-08-11 ENCOUNTER — Ambulatory Visit: Payer: BC Managed Care – PPO | Admitting: Occupational Therapy

## 2018-08-11 ENCOUNTER — Other Ambulatory Visit: Payer: Self-pay

## 2018-08-11 DIAGNOSIS — M6281 Muscle weakness (generalized): Secondary | ICD-10-CM

## 2018-08-11 DIAGNOSIS — M25531 Pain in right wrist: Secondary | ICD-10-CM | POA: Diagnosis not present

## 2018-08-11 DIAGNOSIS — M25631 Stiffness of right wrist, not elsewhere classified: Secondary | ICD-10-CM

## 2018-08-11 NOTE — Therapy (Signed)
[place] [facility] [address], [place], [address] Phone: [phone]   Fax:  [phone]  Occupational Therapy Treatment  Patient Details  Name: John Davis MRN: [id number] Date of Birth: Apr 06, 1985 Referring Provider (OT): Candelaria Stagers   Encounter Date: 08/11/2018  OT End of Session - 08/11/18 0941    Visit Number  7    Number of Visits  12    Date for OT Re-Evaluation  08/31/18    OT Start Time  0900    OT Stop Time  0935    OT Time Calculation (min)  35 min    Activity Tolerance  Patient tolerated treatment well    Behavior During Therapy  [facility] for tasks assessed/performed       Past Medical History:  Diagnosis Date  . Polycythemia 2017   high hemoglobin  . PTSD (post-traumatic stress disorder)   . Vitamin B12 deficiency 11/08/2015    Past Surgical History:  Procedure Laterality Date  . COLONOSCOPY W/ POLYPECTOMY  [phone]   Dr. Bary Castilla  . COLONOSCOPY WITH PROPOFOL N/A 12/26/2015   Procedure: COLONOSCOPY WITH PROPOFOL;  Surgeon: Robert Bellow, MD;  Location: [facility] ENDOSCOPY;  Service: Endoscopy;  Laterality: N/A;    There were no vitals filed for this visit.  Subjective Assessment - 08/11/18 0913    Subjective   Did okay - only pain was little with the vibration doing the fence when holding it with my R hand - did 4 hrs soft splint and 2 hrs hard one- did not feel any pain with exercises    Pertinent History  Was in [place] 05[place]19/[place] - was in soft cast for 2 wks ,then wrist splint another 2 wks but done some AROM during day - -then the last 2 wks were in wrist splint that I bought at [employer] and  wear it about 70% of time - sleeping - mostly with act that wiill be hard on my wrist - I tried Research officer, political party and my wrist hurt - I cannot weight bear - 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  No/denies         [facility] OT Assessment - 08/11/18 0001      AROM   Right Wrist Extension  75 Degrees    Right Wrist Flexion  90 Degrees   80         Measurement taken - see flowsheet      OT Treatments/Exercises (OP) - 08/11/18 0001      RUE Fluidotherapy   Number Minutes Fluidotherapy  6 Minutes    RUE Fluidotherapy Location  Hand;Wrist    Comments  AROM for composite flexion of wrist with fist , extention       Assess AROM for R wrist - in all planes- composite fist flexion - did feel strain in FCU trying to over come tightness in extensors  And strength - no pain with resistance  Joint mobs no pain -  Gentle AAROM for wrist flexion with hand in fist and graston tool nr 2 sweeping over  dorsal forearm  Done by OT  Cont with wrist flexion stretch - composite  And prayer stretch   Carpal rolls done by OT in semi weight bearing position - 2 x 10 reps  Less pain with prayer stretch  And add for pt to do with Benik neoprene - do 2 x 20 reps  No issues   Repeat wall pushup again 10 reps  - pt can do 3 sets of 10-12 at home     Upgrade to 2 lbs to wrist in all planes - 2 sets to 10  Can wean splint to 2 hrs on and off with Benik neoprene and no splint   Will simulate some of his work act - test that he need to past for [employer]          OT Education - 08/11/18 0941    Education Details  progress and changes to  [employer]) Educated  Patient    Methods  Explanation;Demonstration;Tactile cues;Handout    Comprehension  Verbalized understanding;Returned demonstration       OT Short Term Goals - 07/20/18 1203      OT SHORT TERM GOAL #1   Title  Pt to be independent in splint use and HEP to increase AROM while decreasing pain     Baseline   little knowledge     Time  2    Period  Weeks    Status  New    Target Date  08/03/18      OT SHORT TERM GOAL #2   Title  Pain on PRHWE improve with more than 15 points to initiate strenghtening and weaning out of splint     Baseline  at eval PRWHE pain 18/50 - pain with composite fist flexion , wrist extention 5/10    Time  3    Period  Weeks    Status  New    Target Date  08/10/18        OT Long Term Goals - 07/20/18 1206      OT LONG TERM GOAL #1   Title  R wrist strength improve to 5/5 at end range pain free to carry, pull and push less than 10 lbs objects     Baseline  pain at 52 wrist extention , 64 flexion     Time  6    Period  Weeks    Status  New    Target Date  08/31/18      OT LONG TERM GOAL #2   Title  Function score on PRWHE improve with more 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    Baseline  Function score on PRWHE at eval 18/50 -     Time  6    Period  Weeks    Status  New    Target Date  08/31/18            Plan - 08/11/18 2694    Clinical Impression Statement  Pt is about 12 wks from injury to wrist dorsal scaphotriquetral ligament - pt show increase ROM - still composite fist with wrist flexion - felt little cramp on FCU - but decrease after fluido - was able to do table slides weight bearing pain free with Benik on - Wall pushups with out splint no issues- increase weight for wrist to 2 lbs    OT Occupational Profile and History  Detailed Assessment- Review of Records and additional review of physical, cognitive, psychosocial history  related to current functional performance    Occupational performance deficits (Please refer to evaluation for details):  ADL's;Play;Leisure;IADL's;Social Participation;Work    Marketing executive / Function / Physical Skills  ADL;Flexibility;ROM;Strength;Edema;IADL;Pain;UE functional use;Decreased knowledge of precautions    Rehab Potential  Good    Clinical Decision Making  Several treatment options, min-mod  task modification necessary    Modification or Assistance to Complete Evaluation   Min-Moderate modification of tasks or assist with assess necessary to complete eval    OT Frequency  2x / week    OT Duration  --   3 wks   OT Treatment/Interventions  Self-care/ADL training;Cryotherapy;Moist Heat;Fluidtherapy;Ultrasound;Passive range of motion;Patient/family education;Splinting;Manual Therapy;Therapeutic exercise    Plan  assess progress with changes to HEP and splinting    OT Home Exercise Plan  see pt instruction    Consulted and Agree with Plan of Care  Patient       Patient will benefit from skilled therapeutic intervention in order to improve the following deficits and impairments:   Body Structure / Function / Physical Skills: ADL, Flexibility, ROM, Strength, Edema, IADL, Pain, UE functional use, Decreased knowledge of precautions       Visit Diagnosis: 1. Muscle weakness (generalized)   2. Stiffness of right wrist, not elsewhere classified   3. Pain in right wrist       Problem List Patient Active Problem List   Diagnosis Date Noted  . Allergic state 11/29/2015  . Colon polyps 11/29/2015  . Polycythemia 11/08/2015  . B12 deficiency 11/08/2015  . Blood pressure elevated without history of HTN 11/02/2015  . Headache 11/02/2015  . History of colonic polyps 10/27/2015    Rosalyn Gess OTR/L,CLT 08/11/2018, 9:44 AM  [place] [facility] [address], [place], [address] Phone: [phone]   Fax:  [phone]  Name: HERBERT MARKEN MRN: [id number] Date of Birth: 04/05/1985

## 2018-08-11 NOTE — Patient Instructions (Signed)
Cont with composite flexion stretch for wrist  prayer stretch 2 lbs weight for wrist in all planes  2 x 12 reps pain free Table slides with benik splint 2 x 20 pain free  Wall pushups - 3 x 12 reps Pain free  2hrs on and off with Benik soft splint and no splint

## 2018-08-13 ENCOUNTER — Other Ambulatory Visit: Payer: Self-pay

## 2018-08-13 ENCOUNTER — Ambulatory Visit: Payer: BC Managed Care – PPO | Attending: Sports Medicine | Admitting: Occupational Therapy

## 2018-08-13 DIAGNOSIS — M25631 Stiffness of right wrist, not elsewhere classified: Secondary | ICD-10-CM | POA: Insufficient documentation

## 2018-08-13 DIAGNOSIS — M25531 Pain in right wrist: Secondary | ICD-10-CM | POA: Insufficient documentation

## 2018-08-13 DIAGNOSIS — M6281 Muscle weakness (generalized): Secondary | ICD-10-CM | POA: Diagnosis present

## 2018-08-13 NOTE — Therapy (Signed)
[place] [facility] [address] [place], [address] Phone: [phone]   Fax:  [phone]  Occupational Therapy Treatment  Patient Details  Name: John Davis MRN: [id number] Date of Birth: 10/09/1985 Referring Provider (OT): Candelaria Stagers   Encounter Date: 08/13/2018  OT End of Session - 08/13/18 0941    Visit Number  8    Number of Visits  12    Date for OT Re-Evaluation  08/31/18    OT Start Time  0900    OT Stop Time  0938    OT Time Calculation (min)  38 min    Activity Tolerance  Patient tolerated treatment well    Behavior During Therapy  [facility] for tasks assessed/performed       Past Medical History:  Diagnosis Date  . Polycythemia 2017   high hemoglobin  . PTSD (post-traumatic stress disorder)   . Vitamin B12 deficiency 11/08/2015    Past Surgical History:  Procedure Laterality Date  . COLONOSCOPY W/ POLYPECTOMY  [phone]   Dr. Bary Castilla  . COLONOSCOPY WITH PROPOFOL N/A 12/26/2015   Procedure: COLONOSCOPY WITH PROPOFOL;  Surgeon: Robert Bellow, MD;  Location: [facility] ENDOSCOPY;  Service: Endoscopy;  Laterality: N/A;    There were no vitals filed for this visit.  Subjective Assessment - 08/13/18 0940    Subjective   Doing ok - did the soft splint and no splint on and off for 2 hrs alternating - did not had pain except with stretch - done okay - wall pushups no pain    Pertinent History  Was in [place] 05/19/2018 - was in soft cast for 2 wks ,then wrist splint another 2 wks but done some AROM during day - -then the last 2 wks were in wrist splint that I bought at [employer] and  wear it about 70% of time - sleeping - mostly with act that wiill be hard on my wrist - I tried Research officer, political party and my wrist hurt - I cannot weight bear - 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  No/denies         [facility] OT Assessment - 08/13/18 0001      Strength   Right Hand Grip (lbs)  125    Right Hand Lateral Pinch  30 lbs    Right Hand 3 Point Pinch  29 lbs    Left Hand Grip (lbs)  135    Left Hand Lateral Pinch  29 lbs    Left Hand 3 Point Pinch  23 lbs         Assess AROM for R wrist - in all planes- composite fist flexion - did feel strain over dorsal wrist  And strength - no pain with resistancein all planes  Joint mobs no pain    AAROM for wrist flexion with hand in fistand graston tool nr 2 sweeping over dorsal forearm Done by OT Cont with wrist flexion stretch - composite at home  And prayer stretch   Carpal rolls done by OT in semi weight bearing position -  2 x 10 reps  Wall pushups had no pain  Table pushups with Benik - pain free first session - 2nd set some pull with release - pt to increase gradually sets on table with and then without Benik splint  Then 2nd week can try push up from chair with Benik - and decrease sets on table - if pain free   Pt was able to pick up and carry 5 lbs without pull -had pull with 6-8 lbs  Pt can start elbow and shoulder exercises with 5 lbs - one set of 12 and increase this next 7 days  gradually to 3 sets  -and then can try go up in weight 6 then 8 lbs if pain free  Was able to upgrade to 3 lbs for wrist in all planes - but only one set on wrist flexion , extention And 2 sets on RD, UD , sup, pron  Can increase sets in 3 days  And try 4 lbs for RD, UD, sup and pronation 2nd week                 OT Education - 08/13/18 0941    Education Details  progress and update HEP for 2 wks    Person(s) Educated  Patient    Methods  Explanation;Demonstration;Tactile cues;Handout    Comprehension  Verbalized understanding;Returned  demonstration       OT Short Term Goals - 07/20/18 1203      OT SHORT TERM GOAL #1   Title  Pt to be independent in splint use and HEP to increase AROM while decreasing pain     Baseline  little knowledge     Time  2    Period  Weeks    Status  New    Target Date  08/03/18      OT SHORT TERM GOAL #2   Title  Pain on PRHWE improve with more than 15 points to initiate strenghtening and weaning out of splint     Baseline  at eval PRWHE pain 18/50 - pain with composite fist flexion , wrist extention 5/10    Time  3    Period  Weeks    Status  New    Target Date  08/10/18        OT Long Term Goals - 07/20/18 1206      OT LONG TERM GOAL #1   Title  R wrist strength improve to 5/5 at end range pain free to carry, pull and push less than 10 lbs objects     Baseline  pain at 52 wrist extention , 64 flexion     Time  6    Period  Weeks    Status  New    Target Date  08/31/18      OT LONG TERM GOAL #2   Title  Function score on PRWHE improve with more 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    Baseline  Function score on PRWHE at eval 18/50 -     Time  6    Period  Weeks    Status  New    Target Date  08/31/18            Plan - 08/13/18 0942    Clinical Impression Statement  Pt is about 12 1/2 wks from injury to wrist dorsal scaphotriquetal ligament- pt show increase strength able to lift and carrry 5 lbs with out pain or pull- wall pushups pain free -  but still feel pull with table pushups with 2nd set - upgrade wrist to 3 lbs and elbow/shoulder 5 lbs - will cont with HEP for 2 wks    OT Occupational Profile and History  Detailed Assessment- Review of Records and additional review of physical, cognitive, psychosocial history related to current functional performance    Occupational performance deficits (Please refer to evaluation for details):  ADL's;Play;Leisure;IADL's;Social Participation;Work    Marketing executive / Function / Physical Skills   ADL;Flexibility;ROM;Strength;Edema;IADL;Pain;UE functional use;Decreased knowledge of precautions    Rehab Potential  Good    Clinical Decision Making  Several treatment options, min-mod task modification necessary    Modification or Assistance to Complete Evaluation   Min-Moderate modification of tasks or assist with assess necessary to complete eval    OT Frequency  2x / week    OT Duration  4 weeks    OT Treatment/Interventions  Self-care/ADL training;Cryotherapy;Moist Heat;Fluidtherapy;Ultrasound;Passive range of motion;Patient/family education;Splinting;Manual Therapy;Therapeutic exercise    Plan  assess progress with changes to HEP ( weight and weight bearing )    OT Home Exercise Plan  see pt instruction    Consulted and Agree with Plan of Care  Patient       Patient will benefit from skilled therapeutic intervention in order to improve the following deficits and impairments:   Body Structure / Function / Physical Skills: ADL, Flexibility, ROM, Strength, Edema, IADL, Pain, UE functional use, Decreased knowledge of precautions       Visit Diagnosis: 1. Muscle weakness (generalized)   2. Stiffness of right wrist, not elsewhere classified   3. Pain in right wrist       Problem List Patient Active Problem List   Diagnosis Date Noted  . Allergic state 11/29/2015  . Colon polyps 11/29/2015  . Polycythemia 11/08/2015  . B12 deficiency 11/08/2015  . Blood pressure elevated without history of HTN 11/02/2015  . Headache 11/02/2015  . History of colonic polyps 10/27/2015    Rosalyn Gess OTR/L,CLT 08/13/2018, 9:45 AM  [place] [facility] [address], [place], [address] Phone: [phone]   Fax:  [phone]  Name: John Davis MRN: [id number] Date of Birth: 1985/10/02

## 2018-08-13 NOTE — Patient Instructions (Signed)
See note for detail

## 2018-09-01 ENCOUNTER — Other Ambulatory Visit: Payer: Self-pay

## 2018-09-01 ENCOUNTER — Ambulatory Visit: Payer: BC Managed Care – PPO | Admitting: Occupational Therapy

## 2018-09-01 DIAGNOSIS — M25531 Pain in right wrist: Secondary | ICD-10-CM

## 2018-09-01 DIAGNOSIS — M25631 Stiffness of right wrist, not elsewhere classified: Secondary | ICD-10-CM

## 2018-09-01 DIAGNOSIS — M6281 Muscle weakness (generalized): Secondary | ICD-10-CM | POA: Diagnosis not present

## 2018-09-01 NOTE — Patient Instructions (Signed)
See note

## 2018-09-01 NOTE — Therapy (Signed)
[facility] [address], [place], [address] Phone: [phone]   Fax:  [phone]  Occupational Therapy Treatment  Patient Details  Name: John Davis MRN: [id number] Date of Birth: 10/16/1985 Referring Provider (OT): Candelaria Stagers   Encounter Date: 09/01/2018  OT End of Session - 09/01/18 1310    Visit Number  9    Number of Visits  12    Date for OT Re-Evaluation  09/21/18    OT Start Time  0900    OT Stop Time  0950    OT Time Calculation (min)  50 min    Activity Tolerance  Patient tolerated treatment well    Behavior During Therapy  [facility] for tasks assessed/performed       Past Medical History:  Diagnosis Date  . Polycythemia 2017   high hemoglobin  . PTSD (post-traumatic stress disorder)   . Vitamin B12 deficiency 11/08/2015    Past Surgical History:  Procedure Laterality Date  . COLONOSCOPY W/ POLYPECTOMY  [phone]   Dr. Bary Castilla  . COLONOSCOPY WITH PROPOFOL N/A 12/26/2015   Procedure: COLONOSCOPY WITH PROPOFOL;  Surgeon: Robert Bellow, MD;  Location[facility] END[facility]COPY;  Service: Endoscopy;  Laterality: N/A;    There were no vitals filed for this visit.  Subjective Assessment - 09/01/18 1259    Subjective   Did okay - doing 10 lbs weight for my shoulders and elbows and wrist doing okay- doing table pushups and little from chair but wear wrist splint - only feels it when pushing thru my wrist    Pertinent History  Was in [place] 05[place]19/[place] - was in soft cast for 2 wks ,then wrist splint another 2 wks but done some AROM during day - -then the last 2 wks were in wrist splint that I bought at [employer] and  wear it about 70% of time - sleeping - mostly with act that wiill be hard on my wrist - I tried Research officer, political party and my wrist hurt - I cannot weight bear - 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  Yes    Pain Score  1     Pain Location  Wrist    Pain Orientation  Right    Pain Type  Acute pain    Aggravating Factors   pushing up from chair or mat         [facility] OT Assessment - 09/01/18 0001      AROM   Right Wrist Extension  75 Degrees    Right Wrist Flexion  85 Degrees      Strength   Right Hand Grip (lbs)  115    Right Hand Lateral Pinch  28 lbs    Right Hand 3 Point Pinch  24 lbs    Left Hand Grip (lbs)  135    Left Hand Lateral Pinch  27 lbs    Left Hand 3 Point Pinch  22 lbs        Assess AROM for R wrist - in all planes- composite fist flexion -  did feel strain over dorsal wrist but improving  And strength - no pain with resistancein all planes  Joint mobs no pain - pt ed on doing self joint mobs and carpal rolls in weight bearing    AAROM for wrist flexion with hand in fistand graston tool nr 2 sweeping over dorsal wrist and hand - pt feels pull from wrist to 2nd MC during weight bearing on mat - 10 reps with no Benik wrist splint  Pt to cont with wrist flexion stretch - compositeat home  And prayer stretch   Carpal rolls done by OT in semi weight bearing position - 2 x 10 reps - pt ed to do at home  Wall pushups had no pain  Table pushups to be done at home but without  Benik - And chair pushup with 50% Benik wrist splint off   Did feel 1/10 pull with chair pushup - 10 reps dorsal wrist - towards 2nd MC   Was able to carry and lift 10 lbs without issues  Pt provided and done silver TB for pulling, pushing , simulating firehose - 2 x 20 reps - had not pain   Did feel pull dorsal wrist and into 2nd MC during weight bearing on mat - pt to do at home but wear Benik wrist splint and weight shift in all 4's - over hands                   OT Education - 09/01/18 1310    Education Details  progress and update HEP    Person(s) Educated  Patient    Methods  Explanation;Demonstration;Tactile cues;Handout    Comprehension  Verbalized understanding;Returned demonstration       OT Short Term Goals - 09/01/18 1314      OT SHORT TERM GOAL #1   Title  Pt to be independent in splint use and HEP to increase AROM while decreasing pain     Status  Achieved      OT SHORT TERM GOAL #2   Title  Pain on PRHWE improve with more than 15 points to initiate strenghtening and weaning out of splint     Baseline  at eval PRWHE pain 18/50 - pain with composite fist flexion , wrist extention 5/10- only pain with full weight bearing - 1/10    Time  3    Period  Weeks    Status  On-going    Target Date  09/22/18        OT Long Term Goals - 09/01/18 1315      OT LONG TERM GOAL #1   Title  R wrist strength improve to 5/5 at end range pain free to carry, pull and push less than 10 lbs objects     Baseline  wrist strength 5/5 , carry 10 lbs , but cannot push up yet - but can pull about 15 lbs this date    Status  Achieved      OT LONG TERM GOAL #2   Title  Function score on PRWHE improve with more 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    Baseline  Function score on PRWHE at eval 18/50 - cannot crawl yet - can do wall and table pushup but not chair or floor    Time  3    Period  Weeks    Status  On-going    Target Date  09/21/18  Plan - 09/01/18 1311    Clinical Impression Statement  Pt is 4 months out from injury to wrist dorsal scaphotriquental ligament - pt shw increase strength and AROM to R wrist - but weight bearing cont to be issue from chair or mat - pt doing okay with 10 lbs weight for shoulders , elbow and wrist    OT Occupational Profile and History  Detailed Assessment- Review of Records and additional review of physical, cognitive, psychosocial history related  to current functional performance    Occupational performance deficits (Please refer to evaluation for details):  ADL's;Play;Leisure;IADL's;Social Participation;Work    Marketing executive / Function / Physical Skills  ADL;Flexibility;ROM;Strength;Edema;IADL;Pain;UE functional use;Decreased knowledge of precautions    Rehab Potential  Good    Clinical Decision Making  Several treatment options, min-mod task modification necessary    Comorbidities Affecting Occupational Performance:  None    Modification or Assistance to Complete Evaluation   Min-Moderate modification of tasks or assist with assess necessary to complete eval    OT Frequency  1x / week    OT Duration  --   3 wks   OT Treatment/Interventions  Self-care/ADL training;Cryotherapy;Moist Heat;Fluidtherapy;Ultrasound;Passive range of motion;Patient/family education;Splinting;Manual Therapy;Therapeutic exercise    Plan  assess progress with changes to HEP ( weight and weight bearing )    OT Home Exercise Plan  see pt instruction    Consulted and Agree with Plan of Care  Patient       Patient will benefit from skilled therapeutic intervention in order to improve the following deficits and impairments:   Body Structure / Function / Physical Skills: ADL, Flexibility, ROM, Strength, Edema, IADL, Pain, UE functional use, Decreased knowledge of precautions       Visit Diagnosis: 1. Muscle weakness (generalized)   2. Stiffness of right wrist, not elsewhere classified   3. Pain in right wrist       Problem List Patient Active Problem List   Diagnosis Date Noted  . Allergic state 11/29/2015  . Colon polyps 11/29/2015  . Polycythemia 11/08/2015  . B12 deficiency 11/08/2015  . Blood pressure elevated without history of HTN 11/02/2015  . Headache 11/02/2015  . History of colonic polyps 10/27/2015    Rosalyn Gess OTR/L,CLT 09/01/2018, 1:25 PM  [facility] [address] [place], [address] Phone: [phone]   Fax:  [phone]  Name: John Davis MRN: [id number] Date of Birth: 1985-06-10

## 2018-09-02 ENCOUNTER — Encounter: Payer: Self-pay | Admitting: General Surgery

## 2018-09-08 ENCOUNTER — Other Ambulatory Visit: Payer: Self-pay

## 2018-09-08 ENCOUNTER — Ambulatory Visit: Payer: BC Managed Care – PPO | Admitting: Occupational Therapy

## 2018-09-08 DIAGNOSIS — M6281 Muscle weakness (generalized): Secondary | ICD-10-CM | POA: Diagnosis not present

## 2018-09-08 DIAGNOSIS — M25531 Pain in right wrist: Secondary | ICD-10-CM

## 2018-09-08 DIAGNOSIS — M25631 Stiffness of right wrist, not elsewhere classified: Secondary | ICD-10-CM

## 2018-09-08 NOTE — Patient Instructions (Signed)
Pt to look into paraffin bath to use at home -  Ten 10 reps hold 10 sec composite flexion stretch and ext for wrist and digits  Can do carpal rolls and some soft tissue mover dorsal hand at index fingers and wrist 30 sec of weight shift R <> L hand in quad position  30 sec weight shift in quad - FW<> BW over bilateral hands  Crawl FW<> BW 2 steps 30 sec  can increase to 2 x 30 sec in 2-3 days  And then to 1 1/2 min in 5-6 days  Pain should be less than 2/10

## 2018-09-08 NOTE — Therapy (Signed)
[facility] [address] [place], [address] Phone: [phone]   Fax:  [phone]  Occupational Therapy Treatment  Patient Details  Name: John Davis MRN: [id number] Date of Birth: 08-Dec-1985 Referring Provider (OT): Candelaria Stagers   Encounter Date: 09/08/2018  OT End of Session - 09/08/18 2297    Visit Number  10    Number of Visits  12    Date for OT Re-Evaluation  09/21/18    OT Start Time  0940    OT Stop Time  1023    OT Time Calculation (min)  43 min    Activity Tolerance  Patient tolerated treatment well    Behavior During Therapy  [facility] for tasks assessed/performed       Past Medical History:  Diagnosis Date  . Polycythemia 2017   high hemoglobin  . PTSD (post-traumatic stress disorder)   . Vitamin B12 deficiency 11/08/2015    Past Surgical History:  Procedure Laterality Date  . COLONOSCOPY W/ POLYPECTOMY  [phone]   Dr. Bary Castilla  . COLONOSCOPY WITH PROPOFOL N/A 12/26/2015   Procedure: COLONOSCOPY WITH PROPOFOL;  Surgeon: Robert Bellow, MD;  Location: [facility] ENDOSCOPY;  Service: Endoscopy;  Laterality: N/A;    There were no vitals filed for this visit.  Subjective Assessment - 09/08/18 1027    Subjective   Doing okay - I think I over did it wiith the band exercises on my wrist - because Sat my wrist hurt and wore my Soft splint most all day - but did okay with chain saw after Wed -and weight bearing on chair and floor still feels it into my index finger on top of my hand    Pertinent History  Was in [place] 05/19/2018 - was in soft cast for 2 wks ,then wrist splint another 2 wks but done some AROM during day - -then the last 2 wks were in wrist splint that I bought at [employer] and  wear it about 70% of time - sleeping - mostly with act that wiill be hard on my wrist - I tried Research officer, political party and my wrist hurt - I cannot weight bear - 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  Yes    Pain Score  2     Pain Location  Wrist    Pain Orientation  Right    Pain Descriptors / Indicators  Aching    Pain Type  Acute pain    Pain Frequency  Occasional    Aggravating Factors   With weighbearing on floor -         [facility] OT Assessment - 09/08/18 0001      Strength   Right Hand Grip (lbs)  126    Right Hand Lateral Pinch  30 lbs    Right Hand 3 Point Pinch  26 lbs    Left Hand Grip (lbs)  132    Left Hand Lateral Pinch  28 lbs    Left Hand 3 Point Pinch  22 lbs      grip  and prehension improved - Cont to report weight bearing and shifting - some pain into 2nd digit and dorsal hand  Pt to hold off on silver band HEP to wrist  But can do pulling - simulating fire hose pull           OT Treatments/Exercises (OP) - 09/08/18 0001      RUE Paraffin   Number Minutes Paraffin  8 Minutes    RUE Paraffin Location  Wrist   hand   Comments  with composite flexion and ext of wrist /digits      graston tool nr 2 for sweeping and brushing over dorsal hand and wrist - 2nd MC  And carpal rolls and light traction   done PROM and stretch by OT after graston for composite flexion and extention of wrist and digits   Pt to look into paraffin bath to use at home -  Done this date and can do at home  10 reps hold 10 sec composite flexion stretch and ext for wrist and digits  Can do carpal rolls and some soft tissue mover dorsal hand at index fingers and wrist Done this date 30 sec of weight shift R <> L hand in quad position  30 sec weight shift in quad - FW<> BW over bilateral hands  Crawl FW<> BW 2 steps 30 sec  can increase to 2 x 30 sec in 2-3 days  And then to 1 1/2 min in  5-6 days  Pain should be less than 2/10   Had less symptoms this date in wrist with above activities         OT Education - 09/08/18 1033    Education Details  progress and changes to HEP    Person(s) Educated  Patient    Methods  Explanation;Demonstration;Tactile cues;Handout    Comprehension  Verbalized understanding;Returned demonstration       OT Short Term Goals - 09/01/18 1314      OT SHORT TERM GOAL #1   Title  Pt to be independent in splint use and HEP to increase AROM while decreasing pain     Status  Achieved      OT SHORT TERM GOAL #2   Title  Pain on PRHWE improve with more than 15 points to initiate strenghtening and weaning out of splint     Baseline  at eval PRWHE pain 18/50 - pain with composite fist flexion , wrist extention 5/10- only pain with full weight bearing - 1/10    Time  3    Period  Weeks    Status  On-going    Target Date  09/22/18        OT Long Term Goals - 09/01/18 1315      OT LONG TERM GOAL #1   Title  R wrist strength improve to 5/5 at end range pain free to carry, pull and push less than 10 lbs objects     Baseline  wrist strength 5/5 , carry 10 lbs , but cannot push up yet - but can pull about 15 lbs this date    Status  Achieved      OT LONG TERM GOAL #2   Title  Function score on PRWHE improve with more 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    Baseline  Function score on PRWHE at eval 18/50 - cannot crawl yet - can do wall and table pushup but not chair or floor    Time  3  Period  Weeks    Status  On-going    Target Date  09/21/18            Plan - 09/08/18 1242    Clinical Impression Statement  Pt is just over 4 months out from injury to R wrist dorsal scaphotriquental ligament - pt show increase strength in wrist - only bothered by weight bearing and composite flexion and extention of wrist with digits - at end range - recommend this date for pt to get paraffin bath and work on end range and weight  shift HEP the next week - done better this date with weightbearing    OT Occupational Profile and History  Detailed Assessment- Review of Records and additional review of physical, cognitive, psychosocial history related to current functional performance    Occupational performance deficits (Please refer to evaluation for details):  ADL's;Play;Leisure;IADL's;Social Participation;Work    Marketing executive / Function / Physical Skills  ADL;Flexibility;ROM;Strength;Edema;IADL;Pain;UE functional use;Decreased knowledge of precautions    Rehab Potential  Good    Clinical Decision Making  Several treatment options, min-mod task modification necessary    Comorbidities Affecting Occupational Performance:  None    Modification or Assistance to Complete Evaluation   Min-Moderate modification of tasks or assist with assess necessary to complete eval    OT Frequency  1x / week    OT Duration  2 weeks    OT Treatment/Interventions  Self-care/ADL training;Cryotherapy;Moist Heat;Fluidtherapy;Ultrasound;Passive range of motion;Patient/family education;Splinting;Manual Therapy;Therapeutic exercise    Plan  assess progress with changes to HEP ( weight and weight bearing )    OT Home Exercise Plan  see pt instruction       Patient will benefit from skilled therapeutic intervention in order to improve the following deficits and impairments:   Body Structure / Function / Physical Skills: ADL, Flexibility, ROM, Strength, Edema, IADL, Pain, UE functional use, Decreased knowledge of precautions       Visit Diagnosis: 1. Muscle weakness (generalized)   2. Stiffness of right wrist, not elsewhere classified   3. Pain in right wrist       Problem List Patient Active Problem List   Diagnosis Date Noted  . Allergic state 11/29/2015  . Colon polyps 11/29/2015  . Polycythemia 11/08/2015  . B12 deficiency 11/08/2015  . Blood pressure elevated without history of HTN 11/02/2015  . Headache 11/02/2015  . History of  colonic polyps 10/27/2015    Rosalyn Gess OTR/L,CLT 09/08/2018, 12:46 PM  [place] [facility] [address] [place], [address] Phone: [phone]   Fax:  [phone]  Name: John Davis MRN: [id number] Date of Birth: 05-Apr-1985

## 2018-09-15 ENCOUNTER — Ambulatory Visit: Payer: BC Managed Care – PPO | Attending: Sports Medicine | Admitting: Occupational Therapy

## 2018-09-15 ENCOUNTER — Other Ambulatory Visit: Payer: Self-pay

## 2018-09-15 DIAGNOSIS — G8929 Other chronic pain: Secondary | ICD-10-CM | POA: Diagnosis present

## 2018-09-15 DIAGNOSIS — M6281 Muscle weakness (generalized): Secondary | ICD-10-CM | POA: Diagnosis present

## 2018-09-15 DIAGNOSIS — M25631 Stiffness of right wrist, not elsewhere classified: Secondary | ICD-10-CM | POA: Insufficient documentation

## 2018-09-15 DIAGNOSIS — M25561 Pain in right knee: Secondary | ICD-10-CM | POA: Insufficient documentation

## 2018-09-15 DIAGNOSIS — M25531 Pain in right wrist: Secondary | ICD-10-CM | POA: Diagnosis present

## 2018-09-15 DIAGNOSIS — M25562 Pain in left knee: Secondary | ICD-10-CM | POA: Diagnosis present

## 2018-09-15 NOTE — Therapy (Signed)
[facility] [address] [place], [address] Phone: [phone]   Fax:  [phone]  Occupational Therapy Treatment  Patient Details  Name: John Davis MRN: [id number] Date of Birth: 1986/01/02 Referring Provider (OT): Candelaria Stagers   Encounter Date: 09/15/2018  OT End of Session - 09/15/18 0857    Visit Number  11    Number of Visits  12    Date for OT Re-Evaluation  09/21/18    OT Start Time  0859    OT Stop Time  0938    OT Time Calculation (min)  39 min    Activity Tolerance  Patient tolerated treatment well    Behavior During Therapy  [facility] for tasks assessed/performed       Past Medical History:  Diagnosis Date  . Polycythemia 2017   high hemoglobin  . PTSD (post-traumatic stress disorder)   . Vitamin B12 deficiency 11/08/2015    Past Surgical History:  Procedure Laterality Date  . COLONOSCOPY W/ POLYPECTOMY  [id number]   Dr. Bary Castilla  . COLONOSCOPY WITH PROPOFOL N/A 12/26/2015   Procedure: COLONOSCOPY WITH PROPOFOL;  Surgeon: Robert Bellow, MD;  Location: [facility] ENDOSCOPY;  Service: Endoscopy;  Laterality: N/A;    There were no vitals filed for this visit.  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  Yes    Pain  Score  3     Pain Location  Hand    Pain Orientation  Right    Pain Descriptors / Indicators  Aching    Pain Type  Acute pain    Aggravating Factors   Weight bearing at 90 wrist flexion and thru [facility]             assess how pt did with HEP 30 sec of weight shift R <> L hand in quad position  30 sec weight shift in quad - FW<> BW over bilateral hands  Crawl FW<> BW 2 steps 30-45  sec  felt pull into 2nd digit if weight bear at 90 wrist flexion and weight thru digits and MC  But decrease if pressure more on heal of hand but had some discomfort volar wrist at 45 sec   Less than 3/10        OT Treatments/Exercises (OP) - 09/15/18 0001      RUE Paraffin   Number Minutes Paraffin  8 Minutes    RUE Paraffin Location  Wrist    Comments  composite flexio nand extention stretch         graston tool  nr 2 for sweeping and brushing over dorsal hand and wrist - 2nd MC  And vibration And carpal rolls and light traction   done PROM and stretch by OT after graston for composite flexion and extention of wrist and digits   Pt to do moist heat prior  To composite flexion stretch and ext for wrist and digits at home   Can cont carpal rolls and some soft tissue mobs dorsal hand at index fingers and wrist And traction  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      30 sec - 45 sec  weight shift in quad - FW<> BW over bilateral hands no pain of keeping weight off MC - and more thru palm Crawl FW<> BW 5 steps 1 min - less discomfort into  Dorsal 2nd digit -  And no discomfort in volar wrist  Pt to practice crawling and weight shifting - 30 sec to 1 min with weight thru palm  And then simulate and practice crawling 1 min - with weight thru palm  And pain should stay under 1/10  Pt to do composite flexion stretch inbetween as needed    Will follow up with MD Monday 10 th Aug  and doing fitness test on 11th Aug And me that afternoon   Had less  symptoms this date in wrist/2nd digit and even less after moist heat and soft tissue work         OT Education - 09/15/18 0857    Education Details  update and change HEP    Person(s) Educated  Patient    Methods  Explanation;Demonstration;Tactile cues;Handout    Comprehension  Verbalized understanding;Returned demonstration       OT Short Term Goals - 09/01/18 1314      OT SHORT TERM GOAL #1   Title  Pt to be independent in splint use and HEP to increase AROM while decreasing pain     Status  Achieved      OT SHORT TERM GOAL #2   Title  Pain on PRHWE improve with more than 15 points to initiate strenghtening and weaning out of splint     Baseline  at eval PRWHE pain 18/50 - pain with composite fist flexion , wrist extention 5/10- only pain with full weight bearing - 1/10    Time  3    Period  Weeks    Status  On-going    Target Date  09/22/18        OT Long Term Goals - 09/01/18 1315      OT LONG TERM GOAL #1   Title  R wrist strength improve to 5/5 at end range pain free to carry, pull and push less than 10 lbs objects     Baseline  wrist strength 5/5 , carry 10 lbs , but cannot push up yet - but can pull about 15 lbs this date    Status  Achieved      OT LONG TERM GOAL #2   Title  Function score on PRWHE improve with more 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    Baseline  Function score on PRWHE at eval 18/50 - cannot crawl yet - can do wall and table pushup but not chair or floor    Time  3    Period  Weeks    Status  On-going    Target Date  09/21/18  Plan - 09/15/18 0858    Clinical Impression Statement  Pt just over 4 months out from injury to R wrist dorsal scaphotriquetal ligament - pt able to weight shift and crawl for about 1 - 1 1/2 min but need to weight bear thru palm - first time feels pull into dorsal 2nd MC if pushign thru MC's - but less pain if pushing or weight bearing thru palm - when feeling pull into 2nd digit -  to do composite flexion stretch - 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 and follow up MD Monday    OT Occupational Profile and History  Detailed Assessment- Review of Records and additional review of physical, cognitive, psychosocial history related to current functional performance    Occupational performance deficits (Please refer to evaluation for details):  ADL's;Play;Leisure;IADL's;Social Participation;Work    Marketing executive / Function / Physical Skills  ADL;Flexibility;ROM;Strength;Edema;IADL;Pain;UE functional use;Decreased knowledge of precautions    Rehab Potential  Good    Clinical Decision Making  Several treatment options, min-mod task modification necessary    Comorbidities Affecting Occupational Performance:  None    Modification or Assistance to Complete Evaluation   Min-Moderate modification of tasks or assist with assess necessary to complete eval    OT Frequency  1x / week    OT Duration  2 weeks    OT Treatment/Interventions  Self-care/ADL training;Cryotherapy;Moist Heat;Fluidtherapy;Ultrasound;Passive range of motion;Patient/family education;Splinting;Manual Therapy;Therapeutic exercise    Plan  check on pain or discomfort with crawling and doing fireman test    OT Home Exercise Plan  see pt instruction    Consulted and Agree with Plan of Care  Patient       Patient will benefit from skilled therapeutic intervention in order to improve the following deficits and impairments:   Body Structure / Function / Physical Skills: ADL, Flexibility, ROM, Strength, Edema, IADL, Pain, UE functional use, Decreased knowledge of precautions       Visit Diagnosis: 1. Stiffness of right wrist, not elsewhere classified   2. Pain in right wrist   3. Muscle weakness (generalized)       Problem List Patient Active Problem List   Diagnosis Date Noted  . Allergic state 11/29/2015  . Colon polyps 11/29/2015  . Polycythemia 11/08/2015  . B12 deficiency 11/08/2015  . Blood pressure elevated  without history of HTN 11/02/2015  . Headache 11/02/2015  . History of colonic polyps 10/27/2015    Rosalyn Gess OTR/L,CLT 09/15/2018, 10:07 AM  [facility] [address] [place], [address] Phone: [phone]   Fax:  [phone]  Name: DURIEL DEERY MRN: [id number] Date of Birth: 1985/08/24

## 2018-09-15 NOTE — Patient Instructions (Signed)
Cont heat and composite flexion and extnetion stretches - Soft tissue massage, joint mobs and carpal rolls   And practice crawling and weight shifting thru palm - no MC or causing hyper extention of 2nd  And do composite flexion stretch if feel pain into dorsal 2nd MC and digit

## 2018-09-16 ENCOUNTER — Ambulatory Visit: Payer: BC Managed Care – PPO

## 2018-09-16 DIAGNOSIS — G8929 Other chronic pain: Secondary | ICD-10-CM

## 2018-09-16 DIAGNOSIS — M25631 Stiffness of right wrist, not elsewhere classified: Secondary | ICD-10-CM | POA: Diagnosis not present

## 2018-09-16 DIAGNOSIS — M25561 Pain in right knee: Secondary | ICD-10-CM

## 2018-09-16 DIAGNOSIS — M25562 Pain in left knee: Secondary | ICD-10-CM
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
## 2018-09-21 ENCOUNTER — Other Ambulatory Visit: Payer: Self-pay

## 2018-09-21 ENCOUNTER — Ambulatory Visit: Payer: BC Managed Care – PPO

## 2018-09-21 DIAGNOSIS — M25562 Pain in left knee: Secondary | ICD-10-CM

## 2018-09-21 DIAGNOSIS — M25631 Stiffness of right wrist, not elsewhere classified: Secondary | ICD-10-CM | POA: Diagnosis not present

## 2018-09-21 DIAGNOSIS — G8929 Other chronic pain: Secondary | ICD-10-CM

## 2018-09-21 NOTE — Therapy (Signed)
[place] [facility] [address], [place], [address] Phone: [phone]   Fax:  [phone]  Physical Therapy Treatment  Patient Details  Name: ZEYAD DELAGUILA MRN: [id number] Date of Birth: Jun 22, 1985 Referring Provider (PT): Ramonita Lab MD   Encounter Date: 09/21/2018  PT End of Session - 09/21/18 1922    Visit Number  2    Number of Visits  13    Date for PT Re-Evaluation  10/28/18    PT Start Time  1835    PT Stop Time  1922    PT Time Calculation (min)  47 min    Activity Tolerance  Patient tolerated treatment well;No increased pain    Behavior During Therapy  [facility] for tasks assessed/performed       Past Medical History:  Diagnosis Date   Polycythemia 2017   high hemoglobin   PTSD (post-traumatic stress disorder)    Vitamin B12 deficiency 11/08/2015    Past Surgical History:  Procedure Laterality Date   COLONOSCOPY W/ POLYPECTOMY  [phone]   Dr. Bary Castilla   COLONOSCOPY WITH PROPOFOL N/A 12/26/2015   Procedure: COLONOSCOPY WITH PROPOFOL;  Surgeon: Robert Bellow, MD;  Location: [facility] ENDOSCOPY;  Service: Endoscopy;  Laterality: N/A;    There were no vitals filed for this visit.  Subjective Assessment - 09/21/18 1847    Subjective  Pt reports current 0/10 pain. Pt reports some R knee tightness lately. After last session, pt reported having some hamstring stiffness. No major changes since last session.    Patient Stated Goals  Reduce pain/stiffness, return to prior level of function    Currently in Pain?  No/denies    Pain Score  0-No pain       Therapeutic Exercise to improve activity tolerance, strength, and motor control with mobility.  Dynamic warm-up (Butt-kicks, forward leans, high kicks, toy soldiers 2x each) Running on TM, with 2 min warm-up and cool-down 1.11 miles, 13:20 duration L knee 2/10 at min 7 (6.0 speed)~.6 miles in Sx resolved when increased to 7.0 speed Moderate  calf tightness B with lower speeds during cooldown  Static calf stretch 30s x3 Standing lumbar ext 2x10 (HEP) SL Calf Raises 3x10 off 7-inch step with UE support Standing Francis Gaines with 3# 1x10 [place] with 3# 2x12    At the end of the session, pt reported moderate soreness.           PT Education - 09/21/18 1915    Education Details  Pt educated on technique/form. Modified HEP to change standing donkey kicks to perform in quadruped (on forearms) position.    Person(s) Educated  Patient    Methods  Explanation;Demonstration;Tactile cues;Verbal cues    Comprehension  Returned demonstration;Verbalized understanding       PT Short Term Goals - 09/16/18 1156      PT SHORT TERM GOAL #1   Title  Pt will be compliant and independent with his HEP.    Time  2    Period  Weeks    Status  New    Target Date  09/30/18        PT Long Term Goals - 09/16/18 1157      PT LONG TERM GOAL #1   Title  Pt will be able to run for greater than 1 mile of distance without exacerbation of sx.    Time  6    Period  Weeks    Status  New  Target Date  10/28/18      PT LONG TERM GOAL #2   Title  Pt will report an at worst pain/discomfort of 5/10 in B LE to indicate a significantly reduced pain response with running/activity.    Baseline  7/10 worst pain    Time  6    Period  Weeks    Status  New    Target Date  10/28/18      PT LONG TERM GOAL #3   Title  Pt will be able to perform greater than 4 flights of stairs without an increase in sx.    Baseline  4 flights of stairs with increase in sx    Time  6    Period  Weeks    Status  New    Target Date  10/28/18            Plan - 09/21/18 1930    Clinical Impression Statement  Pt demonstrates inter-session carryover, indicated by his improved activity tolerance and reduction of sx exacerbation with increased loads of exercise. Pt able to run ~1 mile of upwards of 7.0 speed on TM without major sx  exacerbation of B LE. Pt verbalized increased B calf tightness (2/10) with reduction of speed to fast walking speeds (<5.0 speed) during cool-down. Pts lumbar spine was assessed with standing lumbar ext (painful) and L lateral flexion (L tightness) bringing on sx. Pt tolerated repeated lumbar ext with resolution of sx. Pt also tolerated increased loads of strengthening exercises without sx increases. Pt continues to demonstrate impaired activity tolerance, increased pain/tightness with dynamic loading, motor control (SL>DL), and impaired running mechanics (impaired hip ext, inc lumbar lordosis, over-reaching). Pt will continue to benefit from skilled therapy treatment in order to return to prior level of function.    Personal Factors and Comorbidities  Comorbidity 1    Examination-Activity Limitations  Squat;Stairs    Examination-Participation Restrictions  Community Activity    Stability/Clinical Decision Making  Stable/Uncomplicated    Rehab Potential  Good    PT Frequency  2x / week    PT Duration  6 weeks    PT Treatment/Interventions  ADLs/Self Care Home Management;Moist Heat;Stair training;Gait training;Functional mobility training;Therapeutic activities;Therapeutic exercise;Neuromuscular re-education;Balance training;Patient/family education;Manual techniques;Passive range of motion;Dry needling;Joint Manipulations;Spinal Manipulations    PT Next Visit Plan  Progress HEP and posterior chain strengthening program    PT Home Exercise Plan  See education section.    Consulted and Agree with Plan of Care  Patient       Patient will benefit from skilled therapeutic intervention in order to improve the following deficits and impairments:  Decreased balance, Decreased endurance, Increased muscle spasms, Improper body mechanics, Pain, Impaired flexibility, Decreased range of motion, Decreased activity tolerance, Decreased strength  Visit Diagnosis: 1. Chronic pain of left knee   2. Chronic pain of  right knee        Problem List Patient Active Problem List   Diagnosis Date Noted   Allergic state 11/29/2015   Colon polyps 11/29/2015   Polycythemia 11/08/2015   B12 deficiency 11/08/2015   Blood pressure elevated without history of HTN 11/02/2015   Headache 11/02/2015   History of colonic polyps 10/27/2015    Scarlette Calico, SPT 09/21/2018, 7:43 PM  [place] [facility] [address] [place], [address] Phone: [phone]   Fax:  [phone]  Name: VADHIR MCNAY MRN: [id number] Date of Birth: Jun 02, 1985

## 2018-09-22 ENCOUNTER — Ambulatory Visit: Payer: BC Managed Care – PPO | Admitting: Occupational Therapy

## 2018-09-22 DIAGNOSIS — M25562 Pain in left knee: Secondary | ICD-10-CM

## 2018-09-22 DIAGNOSIS — M25631 Stiffness of right wrist, not elsewhere classified: Secondary | ICD-10-CM

## 2018-09-22 DIAGNOSIS — M6281 Muscle weakness (generalized): Secondary | ICD-10-CM

## 2018-09-22 DIAGNOSIS — M25531 Pain in right wrist: Secondary | ICD-10-CM

## 2018-09-22 DIAGNOSIS — G8929 Other chronic pain: Secondary | ICD-10-CM
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
## 2018-09-22 NOTE — Patient Instructions (Signed)
Cont with same HEP and doing his fitness test tomorrow -  Discuss with MD about possible shot

## 2018-09-24 ENCOUNTER — Ambulatory Visit: Payer: BC Managed Care – PPO

## 2018-09-24 ENCOUNTER — Other Ambulatory Visit: Payer: Self-pay

## 2018-09-24 DIAGNOSIS — M6281 Muscle weakness (generalized): Secondary | ICD-10-CM

## 2018-09-24 DIAGNOSIS — M25631 Stiffness of right wrist, not elsewhere classified: Secondary | ICD-10-CM | POA: Diagnosis not present

## 2018-09-24 DIAGNOSIS — M25562 Pain in left knee: Secondary | ICD-10-CM

## 2018-09-24 DIAGNOSIS — G8929 Other chronic pain: Secondary | ICD-10-CM

## 2018-09-24 NOTE — Therapy (Signed)
[place] [facility] [address], [place], [address] Phone: [phone]   Fax:  [phone]  Physical Therapy Treatment  Patient Details  Name: John Davis MRN: [id number] Date of Birth: 1985-04-15 Referring Provider (PT): Ramonita Lab MD   Encounter Date: 09/24/2018  PT End of Session - 09/24/18 1006    Visit Number  3    Number of Visits  13    Date for PT Re-Evaluation  10/28/18    PT Start Time  0900    PT Stop Time  6286    PT Time Calculation (min)  55 min    Activity Tolerance  Patient tolerated treatment well;No increased pain    Behavior During Therapy  [facility] for tasks assessed/performed       Past Medical History:  Diagnosis Date   Polycythemia 2017   high hemoglobin   PTSD (post-traumatic stress disorder)    Vitamin B12 deficiency 11/08/2015    Past Surgical History:  Procedure Laterality Date   COLONOSCOPY W/ POLYPECTOMY  [id number]   Dr. Bary Castilla   COLONOSCOPY WITH PROPOFOL N/A 12/26/2015   Procedure: COLONOSCOPY WITH PROPOFOL;  Surgeon: Robert Bellow, MD;  Location: [facility] ENDOSCOPY;  Service: Endoscopy;  Laterality: N/A;    There were no vitals filed for this visit.  Subjective Assessment - 09/24/18 0947    Subjective  Pt reports current 0/10 pain. Pt reports moderate calf/hamstring tightness/soreness after last session but feels better today. Pt states no major changes since last session.    Patient Stated Goals  Reduce pain/stiffness, return to prior level of function    Currently in Pain?  No/denies    Pain Score  0-No pain    Pain Onset  More than a month ago       Therapeutic Exercise to improve activity tolerance, strength, and motor control with mobility.   Dynamic warm-up (Butt-kicks, forward leans, high kicks, toy soldiers 2x each) Running outdoor with slight upward incline, with 2 min warm-up and cool-down 5 min total, 50-70% running speed Concordant sx to B  anterior/posterior knee (R>L), B calf Static calf stretch 30s x3 Static hamstring stretch 20s x3 SL Calf Raises 2x15 off 7-inch step with UE support, 4s eccentric phase Seated SL hamstring curl 35# 2x10 DL Jumps, valgus 2x5 SL Hops, 2x5 valgus 10x pogo hops SL calf raises x20 each LE  Modalities (2) dry needles placed along the gastrocnemius B LEs size .25 x 26mm to decrease increased pain and muscular spasms with patient positioned in prone. Educated patient on potential risks and benefits of Dry needling and patient verbally consents to treatment.   At the end of the session, pt reported moderate soreness.    PT Education - 09/24/18 1005    Education Details  Pt educated on technique/form. Continue HEP as is.    Person(s) Educated  Patient    Methods  Explanation;Verbal cues;Tactile cues;Demonstration    Comprehension  Verbalized understanding;Returned demonstration       PT Short Term Goals - 09/16/18 1156      PT SHORT TERM GOAL #1   Title  Pt will be compliant and independent with his HEP.    Time  2    Period  Weeks    Status  New    Target Date  09/30/18        PT Long Term Goals - 09/16/18 1157      PT LONG TERM GOAL #1   Title  Pt will be able to run for greater than 1 mile of distance without exacerbation of sx.    Time  6    Period  Weeks    Status  New    Target Date  10/28/18      PT LONG TERM GOAL #2   Title  Pt will report an at worst pain/discomfort of 5/10 in B LE to indicate a significantly reduced pain response with running/activity.    Baseline  7/10 worst pain    Time  6    Period  Weeks    Status  New    Target Date  10/28/18      PT LONG TERM GOAL #3   Title  Pt will be able to perform greater than 4 flights of stairs without an increase in sx.    Baseline  4 flights of stairs with increase in sx    Time  6    Period  Weeks    Status  New    Target Date  10/28/18            Plan - 09/24/18 1457    Clinical Impression  Statement  Pt able to tolerate ~5 min of 50-70% self-selected jogging/running velocity before onset of sx increase (B LE concordant sx). Pt able to perform dynamic and static LE movements to help relieve his sx, as well as time to recover. Pt able to tolerate increased intensity of seated HS resisted curls (35#) unilaterally and SL calf raises (2x15-20) with eccentric phase (4 sec) with slight increase in sx. Pt demonstrated impaired plyometric technique with sudden onset of concordant sx. Pt demonstrated increase B knee valgus and increase of PF with loading phase. Pt also demonstrated decreased shock absorption B, which was more evident with SL hopping/pogo hopping. Pts technique improved slightly with cueing but would improve with continued cueing/practice and visual aids. Pt continues to demonstrate impaired activity tolerance, increased pain/tightness with dynamic loading, motor control (SL>DL), and impaired running mechanics (impaired hip ext, inc lumbar lordosis, over-reaching). Pt will continue to benefit from skilled therapy treatment in order to return to prior level of function.    Personal Factors and Comorbidities  Comorbidity 1    Examination-Activity Limitations  Squat;Stairs    Examination-Participation Restrictions  Community Activity    Stability/Clinical Decision Making  Stable/Uncomplicated    Rehab Potential  Good    PT Frequency  2x / week    PT Duration  6 weeks    PT Treatment/Interventions  ADLs/Self Care Home Management;Moist Heat;Stair training;Gait training;Functional mobility training;Therapeutic activities;Therapeutic exercise;Neuromuscular re-education;Balance training;Patient/family education;Manual techniques;Passive range of motion;Dry needling;Joint Manipulations;Spinal Manipulations    PT Next Visit Plan  Progress HEP and posterior chain strengthening program    PT Home Exercise Plan  See education section.    Consulted and Agree with Plan of Care  Patient        Patient will benefit from skilled therapeutic intervention in order to improve the following deficits and impairments:  Decreased balance, Decreased endurance, Increased muscle spasms, Improper body mechanics, Pain, Impaired flexibility, Decreased range of motion, Decreased activity tolerance, Decreased strength  Visit Diagnosis: 1. Chronic pain of left knee   2. Muscle weakness (generalized)   3. Chronic pain of right knee        Problem List Patient Active Problem List   Diagnosis Date Noted   Allergic state 11/29/2015   Colon polyps 11/29/2015   Polycythemia 11/08/2015   B12 deficiency 11/08/2015   Blood pressure  elevated without history of HTN 11/02/2015   Headache 11/02/2015   History of colonic polyps 10/27/2015    Scarlette Calico, SPT 09/24/2018, 2:58 PM  [place] [facility] [address] [place], [address] Phone: [phone]   Fax:  [phone]  Name: John Davis MRN: [id number] Date of Birth: 01-05-1986

## 2018-09-28 ENCOUNTER — Ambulatory Visit: Payer: BC Managed Care – PPO

## 2018-10-01 ENCOUNTER — Ambulatory Visit: Payer: BC Managed Care – PPO

## 2018-10-01 ENCOUNTER — Other Ambulatory Visit: Payer: Self-pay

## 2018-10-01 DIAGNOSIS — M6281 Muscle weakness (generalized): Secondary | ICD-10-CM

## 2018-10-01 DIAGNOSIS — G8929 Other chronic pain: Secondary | ICD-10-CM

## 2018-10-01 DIAGNOSIS — M25631 Stiffness of right wrist, not elsewhere classified: Secondary | ICD-10-CM | POA: Diagnosis not present
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
## 2018-10-07 ENCOUNTER — Ambulatory Visit: Payer: BC Managed Care – PPO

## 2018-10-07 ENCOUNTER — Other Ambulatory Visit: Payer: Self-pay

## 2018-10-07 DIAGNOSIS — M25562 Pain in left knee: Secondary | ICD-10-CM

## 2018-10-07 DIAGNOSIS — M25631 Stiffness of right wrist, not elsewhere classified: Secondary | ICD-10-CM | POA: Diagnosis not present

## 2018-10-07 DIAGNOSIS — M25561 Pain in right knee: Secondary | ICD-10-CM

## 2018-10-07 DIAGNOSIS — M6281 Muscle weakness (generalized): Secondary | ICD-10-CM

## 2018-10-07 DIAGNOSIS — G8929 Other chronic pain: Secondary | ICD-10-CM

## 2018-10-07 NOTE — Therapy (Signed)
[facility] [address] [place], [address] Phone: [phone]   Fax:  [phone]  Physical Therapy Treatment  Patient Details  Name: John Davis MRN: [id number] Date of Birth: 1985-08-26 Referring Provider (PT): Ramonita Lab MD   Encounter Date: 10/07/2018  PT End of Session - 10/07/18 0945    Visit Number  5    Number of Visits  13    Date for PT Re-Evaluation  10/28/18    PT Start Time  0900    PT Stop Time  0945    PT Time Calculation (min)  45 min    Activity Tolerance  Patient tolerated treatment well;No increased pain    Behavior During Therapy  [facility] for tasks assessed/performed       Past Medical History:  Diagnosis Date   Polycythemia 2017   high hemoglobin   PTSD (post-traumatic stress disorder)    Vitamin B12 deficiency 11/08/2015    Past Surgical History:  Procedure Laterality Date   COLONOSCOPY W/ POLYPECTOMY  [phone]   Dr. Bary Castilla   COLONOSCOPY WITH PROPOFOL N/A 12/26/2015   Procedure: COLONOSCOPY WITH PROPOFOL;  Surgeon: Robert Bellow, MD;  Location: [facility] ENDOSCOPY;  Service: Endoscopy;  Laterality: N/A;    There were no vitals filed for this visit.  Subjective Assessment - 10/07/18 0908    Subjective  Pt reports current 0/10 pain. States feeling better after last session, after which he was able to go scuba diving x3 dives without any sx inc.    Patient Stated Goals  Reduce pain/stiffness, return to prior level of function    Currently in Pain?  No/denies    Pain Score  0-No pain    Pain Onset  More than a month ago       Therapeutic Exercise to improve activity tolerance, strength, and motor control with mobility.   Dynamic warm-up (Butt-kicks, forward leans, high kicks, toy soldiers 2x each) Running outdoor with slight upward incline, with 2 min warm-up and cool-down 8 min total, Up to 70% running speed L medial knee pain, moderate soreness R  calf Static calf stretch 30s x3 Static hamstring stretch 20s x3 Slump test pos Seated slump x10 each leg Prone hip ext 2x10 each leg SL Calf Raises 2x15 off 7-inch step with UE support, 4s eccentric phase    Modalities (2) dry needles placed along the gastrocnemius B LEs size .25 x 75mm to decrease increased pain and muscular spasms with patient positioned in prone. Educated patient on potential risks and benefits of Dry needling and patient verbally consents to treatment.     At the end of the session, pt reported moderate soreness                        PT Education - 10/07/18 0942    Education Details  Educated on technique/form. HEP modified to add seated slump to tolerance.    Person(s) Educated  Patient    Methods  Explanation;Demonstration    Comprehension  Verbalized understanding;Returned demonstration       PT Short Term Goals - 10/07/18 0953      PT SHORT TERM GOAL #1   Title  Pt will be compliant and independent with his HEP.    Baseline  10/01/18 Independent with HEP.    Time  2    Period  Weeks    Status  Achieved    Target Date  09/30/18  PT Long Term Goals - 09/16/18 1157      PT LONG TERM GOAL #1   Title  Pt will be able to run for greater than 1 mile of distance without exacerbation of sx.    Time  6    Period  Weeks    Status  New    Target Date  10/28/18      PT LONG TERM GOAL #2   Title  Pt will report an at worst pain/discomfort of 5/10 in B LE to indicate a significantly reduced pain response with running/activity.    Baseline  7/10 worst pain    Time  6    Period  Weeks    Status  New    Target Date  10/28/18      PT LONG TERM GOAL #3   Title  Pt will be able to perform greater than 4 flights of stairs without an increase in sx.    Baseline  4 flights of stairs with increase in sx    Time  6    Period  Weeks    Status  New    Target Date  10/28/18            Plan - 10/07/18 0946    Clinical  Impression Statement  Pt able to tolerate ~8 min of up to ~60%of self-selected jogging/running velocity before onset of sx increase (L medial knee and slight R calf tightness). Pt continues to cite the sx arising with dec in jogging velocity but the sx continued even with increase in velocity. Pt able to tolerate increased intensity with SL calf raises (2x15) with eccentric phase (4 sec) with decreased difficulty (L>R). Pt reports improved capacity in terms of activity tolerance without sx exacerbation/inc sx severity, compared to PT onset. Pt demonstrated increased difficulty with isolated prone hip ext (R>L), and he was assessed via slump test to which he tested positive for neural tension with DF or cervical flexion. Pt continues to demonstrate impaired activity tolerance, increased pain/tightness with dynamic loading, motor control (SL>DL), and impaired running mechanics (impaired hip ext, inc lumbar lordosis, over-reaching). Pt will continue to benefit from skilled therapy treatment in order to return to prior level of function.    Personal Factors and Comorbidities  Comorbidity 1    Examination-Activity Limitations  Squat;Stairs    Examination-Participation Restrictions  Community Activity    Stability/Clinical Decision Making  Stable/Uncomplicated    Rehab Potential  Good    PT Frequency  2x / week    PT Duration  6 weeks    PT Treatment/Interventions  ADLs/Self Care Home Management;Moist Heat;Stair training;Gait training;Functional mobility training;Therapeutic activities;Therapeutic exercise;Neuromuscular re-education;Balance training;Patient/family education;Manual techniques;Passive range of motion;Dry needling;Joint Manipulations;Spinal Manipulations    PT Next Visit Plan  Progress HEP and posterior chain strengthening program    PT Home Exercise Plan  See education section.    Consulted and Agree with Plan of Care  Patient       Patient will benefit from skilled therapeutic intervention  in order to improve the following deficits and impairments:  Decreased balance, Decreased endurance, Increased muscle spasms, Improper body mechanics, Pain, Impaired flexibility, Decreased range of motion, Decreased activity tolerance, Decreased strength  Visit Diagnosis: Chronic pain of left knee  Muscle weakness (generalized)  Chronic pain of right knee     Problem List Patient Active Problem List   Diagnosis Date Noted   Allergic state 11/29/2015   Colon polyps 11/29/2015   Polycythemia 11/08/2015   B12  deficiency 11/08/2015   Blood pressure elevated without history of HTN 11/02/2015   Headache 11/02/2015   History of colonic polyps 10/27/2015    Scarlette Calico, SPT 10/07/2018, 9:53 AM  [facility] [address] [place], [address] Phone: [phone]   Fax:  [phone]  Name: WELCOME PAPALE MRN: [id number] Date of Birth: 1986/02/04

## 2018-10-12 ENCOUNTER — Other Ambulatory Visit: Payer: Self-pay

## 2018-10-12 ENCOUNTER — Ambulatory Visit: Payer: BC Managed Care – PPO

## 2018-10-12 DIAGNOSIS — M6281 Muscle weakness (generalized): Secondary | ICD-10-CM

## 2018-10-12 DIAGNOSIS — M25561 Pain in right knee: Secondary | ICD-10-CM

## 2018-10-12 DIAGNOSIS — G8929 Other chronic pain: Secondary | ICD-10-CM

## 2018-10-12 DIAGNOSIS — M25631 Stiffness of right wrist, not elsewhere classified: Secondary | ICD-10-CM | POA: Diagnosis not present

## 2018-10-12 NOTE — Therapy (Signed)
[facility] [address] [place], [address] Phone: [phone]   Fax:  [phone]  Physical Therapy Treatment  Patient Details  Name: John Davis MRN: [id number] Date of Birth: 1985-03-21 Referring Provider (PT): Ramonita Lab MD   Encounter Date: 10/12/2018  PT End of Session - 10/12/18 1802    Visit Number  6    Number of Visits  13    Date for PT Re-Evaluation  10/28/18    PT Start Time  [id number]    PT Stop Time  1600    PT Time Calculation (min)  45 min    Activity Tolerance  Patient tolerated treatment well;No increased pain    Behavior During Therapy  [facility] for tasks assessed/performed       Past Medical History:  Diagnosis Date  . Polycythemia 2017   high hemoglobin  . PTSD (post-traumatic stress disorder)   . Vitamin B12 deficiency 11/08/2015    Past Surgical History:  Procedure Laterality Date  . COLONOSCOPY W/ POLYPECTOMY  [phone]   Dr. Bary Castilla  . COLONOSCOPY WITH PROPOFOL N/A 12/26/2015   Procedure: COLONOSCOPY WITH PROPOFOL;  Surgeon: Robert Bellow, MD;  Location: [facility] ENDOSCOPY;  Service: Endoscopy;  Laterality: N/A;    There were no vitals filed for this visit.  Subjective Assessment - 10/12/18 1801    Subjective  Patient reports feeling a little increase in tightness after performing SCUBA activities this weekend and states the tightness has been slightly increased today. Patient also reports increased difficulty with beind his big toe and digits 2-5 B.    Patient Stated Goals  Reduce pain/stiffness, return to prior level of function    Currently in Pain?  No/denies    Pain Onset  More than a month ago       Therapeutic Exercise to improve activity tolerance, strength, and motor control with mobility.  Dynamic warm-up (Butt-kicks, forward leans, high kicks, toy soldiers 2x each) Running outdoor with slight upward incline, with 2 min warm-up and cool-down 8 min total,  Up to 70% running speed Resisted inversion of the foot -- x 10 with 5 sec holds Resisted eversion of the foot -- x 10 with 5 sec holds Resisted toe flexion -- x 10 with 5 sec holds with use of YTB SLR with leg straight -- x 10 in long sitting   Manual therapy: (performed during dry needling)  STM performed to the flexor digitorum longus, flexor hallucis longus and gastroc with patient in prone to decrease increased muscular guarding and pain  (2) dry needles placed along the gastrocnemius B LEs size .25 x 33mm to decrease increased pain and muscular spasms with patient positioned in prone. Educated patient on potential risks and benefits of Dry needling and patient verbally consents to treatment.   At the end of the session, pt reported moderate soreness     PT Education - 10/12/18 1802    Education Details  Form/technique; Greater toes flexion with YTB    Person(s) Educated  Patient    Methods  Explanation;Demonstration    Comprehension  Verbalized understanding;Returned demonstration       PT Short Term Goals - 10/07/18 0953      PT SHORT TERM GOAL #1   Title  Pt will be compliant and independent with his HEP.    Baseline  10/01/18 Independent with HEP.    Time  2    Period  Weeks    Status  Achieved  Target Date  09/30/18        PT Long Term Goals - 09/16/18 1157      PT LONG TERM GOAL #1   Title  Pt will be able to run for greater than 1 mile of distance without exacerbation of sx.    Time  6    Period  Weeks    Status  New    Target Date  10/28/18      PT LONG TERM GOAL #2   Title  Pt will report an at worst pain/discomfort of 5/10 in B LE to indicate a significantly reduced pain response with running/activity.    Baseline  7/10 worst pain    Time  6    Period  Weeks    Status  New    Target Date  10/28/18      PT LONG TERM GOAL #3   Title  Pt will be able to perform greater than 4 flights of stairs without an increase in sx.    Baseline  4 flights  of stairs with increase in sx    Time  6    Period  Weeks    Status  New    Target Date  10/28/18            Plan - 10/12/18 1803    Clinical Impression Statement  Patient demonstrates less pain along the gastroc with running at an increased cadence of ~160 bpm, however, demonstrates increased lateral/medial ankle pain with ambulating on his forefeet. Upon further examination, patient demonstrates significant limitations with greater toe flexion B with ~3+/5 MMT with the foot in dorsiflexion and ~ 2/5 MMT with foot in plantarflexion. Dry needling results in increased tenderness along the area that is improved with movement. Educated patient to perform instrinsic foot muscular at home and patient will benefit from further skilled therapy to return to prior level of function.    Personal Factors and Comorbidities  Comorbidity 1    Examination-Activity Limitations  Squat;Stairs    Examination-Participation Restrictions  Community Activity    Stability/Clinical Decision Making  Stable/Uncomplicated    Rehab Potential  Good    PT Frequency  2x / week    PT Duration  6 weeks    PT Treatment/Interventions  ADLs/Self Care Home Management;Moist Heat;Stair training;Gait training;Functional mobility training;Therapeutic activities;Therapeutic exercise;Neuromuscular re-education;Balance training;Patient/family education;Manual techniques;Passive range of motion;Dry needling;Joint Manipulations;Spinal Manipulations    PT Next Visit Plan  Progress HEP and posterior chain strengthening program    PT Home Exercise Plan  See education section.    Consulted and Agree with Plan of Care  Patient       Patient will benefit from skilled therapeutic intervention in order to improve the following deficits and impairments:  Decreased balance, Decreased endurance, Increased muscle spasms, Improper body mechanics, Pain, Impaired flexibility, Decreased range of motion, Decreased activity tolerance, Decreased  strength  Visit Diagnosis: Chronic pain of left knee  Muscle weakness (generalized)  Chronic pain of right knee     Problem List Patient Active Problem List   Diagnosis Date Noted  . Allergic state 11/29/2015  . Colon polyps 11/29/2015  . Polycythemia 11/08/2015  . B12 deficiency 11/08/2015  . Blood pressure elevated without history of HTN 11/02/2015  . Headache 11/02/2015  . History of colonic polyps 10/27/2015    Blythe Stanford, PT DPT 10/12/2018, 6:07 PM  [facility] [address] [place], [address] Phone: [phone]  Fax:  [phone]  Name: John Davis MRN: [id number] Date of Birth: 1985/08/04

## 2018-10-14 ENCOUNTER — Other Ambulatory Visit: Payer: Self-pay

## 2018-10-14 ENCOUNTER — Ambulatory Visit: Payer: BC Managed Care – PPO | Attending: Internal Medicine

## 2018-10-14 DIAGNOSIS — G8929 Other chronic pain: Secondary | ICD-10-CM

## 2018-10-14 DIAGNOSIS — M25562 Pain in left knee: Secondary | ICD-10-CM | POA: Insufficient documentation

## 2018-10-14 DIAGNOSIS — M25561 Pain in right knee: Secondary | ICD-10-CM | POA: Diagnosis present

## 2018-10-14 DIAGNOSIS — M6281 Muscle weakness (generalized): Secondary | ICD-10-CM | POA: Diagnosis present

## 2018-10-15 NOTE — Therapy (Signed)
[facility] [address], [place], [address] Phone: [phone]   Fax:  [phone]  Physical Therapy Treatment  Patient Details  Name: John Davis MRN: [id number] Date of Birth: [birth year] Referring Provider (PT): Ramonita Lab MD   Encounter Date: 10/14/2018  PT End of Session - 10/15/18 1547    Visit Number  7    Number of Visits  13    Date for PT Re-Evaluation  10/28/18    PT Start Time  1600    PT Stop Time  [id number]    PT Time Calculation (min)  45 min    Activity Tolerance  Patient tolerated treatment well;No increased pain    Behavior During Therapy  WFL for tasks assessed/performed       Past Medical History:  Diagnosis Date  . Polycythemia 2017   high hemoglobin  . PTSD (post-traumatic stress disorder)   . Vitamin B12 deficiency 11/08/2015    Past Surgical History:  Procedure Laterality Date  . COLONOSCOPY W/ POLYPECTOMY  [phone]   Dr. Bary Castilla  . COLONOSCOPY WITH PROPOFOL N/A 12/26/2015   Procedure: COLONOSCOPY WITH PROPOFOL;  Surgeon: Robert Bellow, MD;  Location: [facility] ENDOSCOPY;  Service: Endoscopy;  Laterality: N/A;    There were no vitals filed for this visit.  Subjective Assessment - 10/14/18 1610    Subjective  Patient states his legs did not bother him when SCUBA diving today. Patient states he has been improving and had a chance to run for 36min where the pain did not bother his LEs. Patient reports his run was at a lower level since he was running with someone just getting into running.    Patient Stated Goals  Reduce pain/stiffness, return to prior level of function    Currently in Pain?  No/denies    Pain Onset  More than a month ago      TREATMENT   Therapeutic Exercise to improve activity tolerance, strength, and motor control with mobility.   Dynamic warm-up (Butt-kicks, forward leans, high kicks, toy soldiers 2x each) Running outdoor with slight upward  incline, with 2 min warm-up and cool-down 8 min total, Up to 70% running speed Resisted inversion of the foot -- x 25 with blue theraband Resisted eversion of the foot -- x 25 with blue theraband  SLR with leg straight -- x 25 in long sitting  Resisted toe flexion; great toe -- x 10 with 5 sec holds with use of YTB Hip Hiking off of 6" step - x 10 Heel taps SL lowering from 6" step - x 10     Dry Needling (Modalities) -- 5 min   (2) dry needles placed along the gastrocnemius B LEs size .25 x 35mm to decrease increased pain and muscular spasms with patient positioned in prone. Educated patient on potential risks and benefits of Dry needling and patient verbally consents to treatment.     At the end of the session, pt reported moderate soreness  PT Education - 10/15/18 1547    Education Details  form/technique; Improving cadence with running    Person(s) Educated  Patient    Methods  Explanation;Demonstration    Comprehension  Verbalized understanding;Returned demonstration       PT Short Term Goals - 10/07/18 0953      PT SHORT TERM GOAL #1   Title  Pt will be compliant and independent with his HEP.    Baseline  10/01/18 Independent with HEP.  Time  2    Period  Weeks    Status  Achieved    Target Date  09/30/18        PT Long Term Goals - 09/16/18 1157      PT LONG TERM GOAL #1   Title  Pt will be able to run for greater than 1 mile of distance without exacerbation of sx.    Time  6    Period  Weeks    Status  New    Target Date  10/28/18      PT LONG TERM GOAL #2   Title  Pt will report an at worst pain/discomfort of 5/10 in B LE to indicate a significantly reduced pain response with running/activity.    Baseline  7/10 worst pain    Time  6    Period  Weeks    Status  New    Target Date  10/28/18      PT LONG TERM GOAL #3   Title  Pt will be able to perform greater than 4 flights of stairs without an increase in sx.    Baseline  4 flights of stairs with  increase in sx    Time  6    Period  Weeks    Status  New    Target Date  10/28/18            Plan - 10/15/18 1548    Clinical Impression Statement  Increased in pain with running mostly noted along the R LE along the medial aspect of the foot/ankle. Upon further inspection, patient demonstrates increased trigger point and muscular guarding along the foot inverters and most specifically along the flexor hallucis longus. Decreased pain after performing dry needling to the area. Patient conitnues to have pain with running and patient will benefit from further skilled therapy to return to prior level of function.    Personal Factors and Comorbidities  Comorbidity 1    Examination-Activity Limitations  Squat;Stairs    Examination-Participation Restrictions  Community Activity    Stability/Clinical Decision Making  Stable/Uncomplicated    Rehab Potential  Good    PT Frequency  2x / week    PT Duration  6 weeks    PT Treatment/Interventions  ADLs/Self Care Home Management;Moist Heat;Stair training;Gait training;Functional mobility training;Therapeutic activities;Therapeutic exercise;Neuromuscular re-education;Balance training;Patient/family education;Manual techniques;Passive range of motion;Dry needling;Joint Manipulations;Spinal Manipulations    PT Next Visit Plan  Progress HEP and posterior chain strengthening program    PT Home Exercise Plan  See education section.    Consulted and Agree with Plan of Care  Patient       Patient will benefit from skilled therapeutic intervention in order to improve the following deficits and impairments:  Decreased balance, Decreased endurance, Increased muscle spasms, Improper body mechanics, Pain, Impaired flexibility, Decreased range of motion, Decreased activity tolerance, Decreased strength  Visit Diagnosis: Chronic pain of left knee  Muscle weakness (generalized)  Chronic pain of right knee     Problem List Patient Active Problem List    Diagnosis Date Noted  . Allergic state 11/29/2015  . Colon polyps 11/29/2015  . Polycythemia 11/08/2015  . B12 deficiency 11/08/2015  . Blood pressure elevated without history of HTN 11/02/2015  . Headache 11/02/2015  . History of colonic polyps 10/27/2015    Blythe Stanford, PT DPT 10/15/2018, 3:52 PM  [facility] [address], [place], [address] Phone: [phone]   Fax:  [phone]  Name: John Davis MRN: [id number] Date of Birth: 1985-12-24

## 2018-10-21 ENCOUNTER — Ambulatory Visit: Payer: BC Managed Care – PPO

## 2018-10-23 ENCOUNTER — Telehealth: Payer: Self-pay

## 2018-10-23 NOTE — Telephone Encounter (Signed)
Spoke with the patient to let him know that he is due for his colonoscopy in November. As Dr Bary Castilla is no longer here he will need to be referred to another physician for this procedure. He states that he would like to do some research about providers and will call us back with whom he would like to see.

## 2018-10-28 ENCOUNTER — Other Ambulatory Visit: Payer: Self-pay

## 2018-10-28 ENCOUNTER — Ambulatory Visit: Payer: BC Managed Care – PPO

## 2018-10-28 DIAGNOSIS — G8929 Other chronic pain: Secondary | ICD-10-CM

## 2018-10-28 DIAGNOSIS — M6281 Muscle weakness (generalized): Secondary | ICD-10-CM

## 2018-10-28 DIAGNOSIS — M25562 Pain in left knee: Secondary | ICD-10-CM | POA: Diagnosis not present

## 2018-10-28 NOTE — Therapy (Signed)
[facility] [address], [place], [address] Phone: [phone]   Fax:  [phone]  Physical Therapy Treatment  Patient Details  Name: John Davis MRN: [id number] Date of Birth: 1985-05-08 Referring Provider (PT): Ramonita Lab MD   Encounter Date: 10/28/2018  PT End of Session - 10/28/18 1327    Visit Number  8    Number of Visits  13    Date for PT Re-Evaluation  10/28/18    PT Start Time  1115    PT Stop Time  1200    PT Time Calculation (min)  45 min    Activity Tolerance  Patient tolerated treatment well;No increased pain    Behavior During Therapy  WFL for tasks assessed/performed       Past Medical History:  Diagnosis Date  . Polycythemia 2017   high hemoglobin  . PTSD (post-traumatic stress disorder)   . Vitamin B12 deficiency 11/08/2015    Past Surgical History:  Procedure Laterality Date  . COLONOSCOPY W/ POLYPECTOMY  [phone]   Dr. Bary Castilla  . COLONOSCOPY WITH PROPOFOL N/A 12/26/2015   Procedure: COLONOSCOPY WITH PROPOFOL;  Surgeon: Robert Bellow, MD;  Location: [facility] ENDOSCOPY;  Service: Endoscopy;  Laterality: N/A;    There were no vitals filed for this visit.  Subjective Assessment - 10/28/18 1325    Subjective  Patient reports he had less pain with runnign and reports a "tightness" which improves as her continues through the motion. Patient states improvement overall but reprots he has been habing increased knee pain with performing biking motions.    Patient Stated Goals  Reduce pain/stiffness, return to prior level of function    Currently in Pain?  No/denies    Pain Onset  More than a month ago       TREATMENT    Therapeutic Exercise to improve activity tolerance, strength, and motor control with mobility.    Biking outdoor with slight upward incline and cool-down 8 min total, Up to 70% running speed SLR with leg straight -- x 25 in long sitting  Heel taps SL  forward lowering from 6" step - x 10  Hip abduction in sidelying -- x 5 B     Dry Needling/Manual therapy  -- 20 min   STM performed to the patient's VMO B to decrease increased pain and spasms with patient positioned in supine (2) dry needles placed along the VMO B LEs size .25 x 89mm to decrease increased pain and muscular spasms with patient positioned in prone. Educated patient on potential risks and benefits of Dry needling and patient verbally consents to treatment.     At the end of the session, pt reported moderate soreness     PT Education - 10/28/18 1327    Education Details  form/technique; Step downs from 6" step added to HEP    Person(s) Educated  Patient    Methods  Explanation;Demonstration    Comprehension  Verbalized understanding;Returned demonstration       PT Short Term Goals - 10/07/18 0953      PT SHORT TERM GOAL #1   Title  Pt will be compliant and independent with his HEP.    Baseline  10/01/18 Independent with HEP.    Time  2    Period  Weeks    Status  Achieved    Target Date  09/30/18        PT Long Term Goals - 09/16/18 1157  PT LONG TERM GOAL #1   Title  Pt will be able to run for greater than 1 mile of distance without exacerbation of sx.    Time  6    Period  Weeks    Status  New    Target Date  10/28/18      PT LONG TERM GOAL #2   Title  Pt will report an at worst pain/discomfort of 5/10 in B LE to indicate a significantly reduced pain response with running/activity.    Baseline  7/10 worst pain    Time  6    Period  Weeks    Status  New    Target Date  10/28/18      PT LONG TERM GOAL #3   Title  Pt will be able to perform greater than 4 flights of stairs without an increase in sx.    Baseline  4 flights of stairs with increase in sx    Time  6    Period  Weeks    Status  New    Target Date  10/28/18            Plan - 10/28/18 1327    Clinical Impression Statement  Focused on improving patient's B knee pain today,  patient has symptoms consistent with Patellofemoral syndrome as indicated by increased pain with resisted knee extension, postive step down test, and pain with palapation of over the VMO. Patient demosntrates a decrease in pain after performing manual therapy and dry needling to the area indicating decreased muscular spasms and guarding. Patient is improving overall but continues to have increase in pain. Will readdress goals on next visit.    Personal Factors and Comorbidities  Comorbidity 1    Examination-Activity Limitations  Squat;Stairs    Examination-Participation Restrictions  Community Activity    Stability/Clinical Decision Making  Stable/Uncomplicated    Rehab Potential  Good    PT Frequency  2x / week    PT Duration  6 weeks    PT Treatment/Interventions  ADLs/Self Care Home Management;Moist Heat;Stair training;Gait training;Functional mobility training;Therapeutic activities;Therapeutic exercise;Neuromuscular re-education;Balance training;Patient/family education;Manual techniques;Passive range of motion;Dry needling;Joint Manipulations;Spinal Manipulations    PT Next Visit Plan  Progress HEP and posterior chain strengthening program    PT Home Exercise Plan  See education section.    Consulted and Agree with Plan of Care  Patient       Patient will benefit from skilled therapeutic intervention in order to improve the following deficits and impairments:  Decreased balance, Decreased endurance, Increased muscle spasms, Improper body mechanics, Pain, Impaired flexibility, Decreased range of motion, Decreased activity tolerance, Decreased strength  Visit Diagnosis: Chronic pain of left knee  Muscle weakness (generalized)  Chronic pain of right knee     Problem List Patient Active Problem List   Diagnosis Date Noted  . Allergic state 11/29/2015  . Colon polyps 11/29/2015  . Polycythemia 11/08/2015  . B12 deficiency 11/08/2015  . Blood pressure elevated without history of HTN  11/02/2015  . Headache 11/02/2015  . History of colonic polyps 10/27/2015    Blythe Stanford, PT DPT 10/28/2018, 1:36 PM  [facility] [address], [place], [address] Phone: [phone]   Fax:  [phone]  Name: John Davis MRN: [id number] Date of Birth: 1985/06/12

## 2018-11-05 ENCOUNTER — Ambulatory Visit: Payer: BC Managed Care – PPO

## 2018-11-12 ENCOUNTER — Ambulatory Visit: Payer: BC Managed Care – PPO | Attending: Internal Medicine

## 2018-11-17 ENCOUNTER — Ambulatory Visit: Payer: BC Managed Care – PPO

## 2018-11-19 ENCOUNTER — Ambulatory Visit: Payer: BC Managed Care – PPO

## 2018-11-20 ENCOUNTER — Telehealth: Payer: Self-pay

## 2018-11-20 NOTE — Telephone Encounter (Signed)
Patient has decided that he will continue his colonoscopy care with Dr Bary Castilla. He will stop by to fill out a release of records form.

## 2018-12-28 ENCOUNTER — Other Ambulatory Visit: Payer: Self-pay

## 2018-12-28 ENCOUNTER — Other Ambulatory Visit: Payer: Self-pay | Admitting: Physician Assistant

## 2018-12-28 ENCOUNTER — Ambulatory Visit
Admission: RE | Admit: 2018-12-28 | Discharge: 2018-12-28 | Disposition: A | Discharge status: Home / Self Care | Payer: BC Managed Care – PPO | Source: Ambulatory Visit | Attending: Physician Assistant | Admitting: Physician Assistant

## 2018-12-28 DIAGNOSIS — M7989 Other specified soft tissue disorders: Secondary | ICD-10-CM | POA: Diagnosis present

## 2018-12-30 ENCOUNTER — Other Ambulatory Visit: Payer: Self-pay | Admitting: General Surgery

## 2019-01-04 ENCOUNTER — Other Ambulatory Visit: Payer: Self-pay | Admitting: Internal Medicine

## 2019-01-04 DIAGNOSIS — R590 Localized enlarged lymph nodes: Secondary | ICD-10-CM

## 2019-01-04 DIAGNOSIS — M25569 Pain in unspecified knee: Secondary | ICD-10-CM

## 2019-01-06 ENCOUNTER — Ambulatory Visit
Admission: RE | Admit: 2019-01-06 | Discharge: 2019-01-06 | Disposition: A | Discharge status: Home / Self Care | Payer: BC Managed Care – PPO | Source: Ambulatory Visit | Attending: Internal Medicine | Admitting: Internal Medicine

## 2019-01-06 ENCOUNTER — Ambulatory Visit: Admission: RE | Admit: 2019-01-06 | Payer: BC Managed Care – PPO | Source: Ambulatory Visit

## 2019-01-06 ENCOUNTER — Other Ambulatory Visit: Payer: Self-pay

## 2019-01-06 DIAGNOSIS — M25569 Pain in unspecified knee: Secondary | ICD-10-CM | POA: Insufficient documentation

## 2019-01-06 DIAGNOSIS — R590 Localized enlarged lymph nodes: Secondary | ICD-10-CM | POA: Insufficient documentation

## 2019-01-06 DIAGNOSIS — M79669 Pain in unspecified lower leg: Secondary | ICD-10-CM | POA: Diagnosis present

## 2019-01-06 MED ORDER — IOHEXOL 300 MG/ML  SOLN
100.0000 mL | Freq: Once | INTRAMUSCULAR | Status: AC | PRN
  Administered 2019-01-06: 15:00:00 100 mL via INTRAVENOUS

## 2019-01-14 ENCOUNTER — Other Ambulatory Visit: Payer: Self-pay

## 2019-01-14 ENCOUNTER — Ambulatory Visit
Admission: RE | Admit: 2019-01-14 | Discharge: 2019-01-14 | Disposition: A | Discharge status: Home / Self Care | Payer: BC Managed Care – PPO | Source: Ambulatory Visit | Attending: Urology | Admitting: Urology

## 2019-01-14 DIAGNOSIS — D751 Secondary polycythemia: Secondary | ICD-10-CM | POA: Insufficient documentation

## 2019-01-26 ENCOUNTER — Other Ambulatory Visit (HOSPITAL_COMMUNITY): Payer: Self-pay | Admitting: Internal Medicine

## 2019-01-26 ENCOUNTER — Other Ambulatory Visit: Payer: Self-pay | Admitting: Internal Medicine

## 2019-01-26 DIAGNOSIS — R2242 Localized swelling, mass and lump, left lower limb: Secondary | ICD-10-CM

## 2019-01-27 ENCOUNTER — Other Ambulatory Visit: Payer: Self-pay

## 2019-01-27 ENCOUNTER — Ambulatory Visit
Admission: RE | Admit: 2019-01-27 | Discharge: 2019-01-27 | Disposition: A | Discharge status: Home / Self Care | Payer: BC Managed Care – PPO | Source: Ambulatory Visit | Attending: Internal Medicine | Admitting: Internal Medicine

## 2019-01-27 DIAGNOSIS — R2242 Localized swelling, mass and lump, left lower limb: Secondary | ICD-10-CM | POA: Diagnosis not present

## 2019-01-27 MED ORDER — GADOBUTROL 1 MMOL/ML IV SOLN
7.0000 mL | Freq: Once | INTRAVENOUS | Status: AC | PRN
  Administered 2019-01-27: 7 mL via INTRAVENOUS

## 2019-02-01 ENCOUNTER — Ambulatory Visit: Payer: Self-pay | Admitting: General Surgery

## 2019-02-01 ENCOUNTER — Other Ambulatory Visit: Payer: BC Managed Care – PPO

## 2019-02-03 ENCOUNTER — Ambulatory Visit: Admit: 2019-02-03 | Payer: BC Managed Care – PPO | Admitting: General Surgery

## 2019-02-03 SURGERY — COLONOSCOPY WITH PROPOFOL
Anesthesia: General

## 2019-03-15 ENCOUNTER — Other Ambulatory Visit: Payer: Self-pay

## 2019-03-15 ENCOUNTER — Ambulatory Visit
Admission: RE | Admit: 2019-03-15 | Discharge: 2019-03-15 | Disposition: A | Discharge status: Home / Self Care | Payer: BC Managed Care – PPO | Source: Ambulatory Visit | Attending: General Surgery | Admitting: General Surgery

## 2019-03-15 ENCOUNTER — Ambulatory Visit
Admission: EM | Admit: 2019-03-15 | Discharge: 2019-03-15 | Disposition: A | Discharge status: Home / Self Care | Payer: BC Managed Care – PPO | Attending: Family Medicine | Admitting: Family Medicine

## 2019-03-15 ENCOUNTER — Ambulatory Visit
Admit: 2019-03-15 | Discharge: 2019-03-15 | Disposition: A | Discharge status: Home / Self Care | Payer: BC Managed Care – PPO | Attending: Family Medicine | Admitting: Family Medicine

## 2019-03-15 ENCOUNTER — Encounter: Payer: Self-pay | Admitting: Emergency Medicine

## 2019-03-15 ENCOUNTER — Other Ambulatory Visit: Payer: Self-pay | Admitting: General Surgery

## 2019-03-15 DIAGNOSIS — M7989 Other specified soft tissue disorders: Secondary | ICD-10-CM

## 2019-03-15 DIAGNOSIS — R0602 Shortness of breath: Secondary | ICD-10-CM | POA: Diagnosis not present

## 2019-03-15 MED ORDER — IOHEXOL 350 MG/ML SOLN
75.0000 mL | Freq: Once | INTRAVENOUS | Status: AC | PRN
  Administered 2019-03-15: 18:00:00 75 mL via INTRAVENOUS

## 2019-03-15 NOTE — ED Provider Notes (Signed)
[facility]    CSN: [id number] Arrival date & time: 03/15/19  1549      History   Chief Complaint Chief Complaint  Patient presents with  . Shortness of Breath   HPI  34 year old male presents with shortness of breath.  Patient has had recent surgery regarding leg mass.  Had a biopsy on 1/22.  Patient reports that on Wednesday and Thursday of this past week he developed leg swelling.  Patient had an ultrasound earlier today which was negative for DVT.  He has had ongoing shortness of breath since Saturday.  He informed his physicians who subsequently sent him to urgent care for evaluation.  Concern for underlying PE.  Patient reports shortness of breath particular with exertion.  He states that he is very active and is normally able to exercise vigorously without difficulty.  He had difficulty exercising today.  He has no other respiratory symptoms.  No current leg swelling.  His incision looks well.  Denies chest pain.  Given recent surgery and history of polycythemia, there is concern for pulmonary embolus.  No other associated symptoms.  No other complaints.  Past Medical History:  Diagnosis Date  . Polycythemia 2017   high hemoglobin  . PTSD (post-traumatic stress disorder)   . Vitamin B12 deficiency 11/08/2015    Patient Active Problem List   Diagnosis Date Noted  . Allergic state 11/29/2015  . Colon polyps 11/29/2015  . Polycythemia 11/08/2015  . B12 deficiency 11/08/2015  . Blood pressure elevated without history of HTN 11/02/2015  . Headache 11/02/2015  . History of colonic polyps 10/27/2015    Past Surgical History:  Procedure Laterality Date  . COLONOSCOPY W/ POLYPECTOMY  [phone]   Dr. Bary Castilla  . COLONOSCOPY WITH PROPOFOL N/A 12/26/2015   Procedure: COLONOSCOPY WITH PROPOFOL;  Surgeon: Robert Bellow, MD;  Location: [facility] ENDOSCOPY;  Service: Endoscopy;  Laterality: N/A;       Home Medications    Prior to Admission medications     Medication Sig Start Date End Date Taking? Authorizing Provider  aspirin 325 MG EC tablet SMARTSIG:1 Tablet(s) By Mouth Daily 03/05/19  Yes [provider]  doxycycline (MONODOX) 100 MG capsule Take 100 mg by mouth 2 (two) times daily. 03/13/19  Yes [provider]    Family History Family History  Problem Relation Age of Onset  . Colon polyps Sister        half sister  . Colon polyps Maternal Grandfather   . Colon cancer Maternal Grandfather   . Colon cancer Mother     Social History Social History   Tobacco Use  . Smoking status: Never Smoker  . Smokeless tobacco: Never Used  Substance Use Topics  . Alcohol use: No  . Drug use: No     Allergies   Patient has no known allergies.   Review of Systems Review of Systems  Constitutional: Negative.   Respiratory: Positive for shortness of breath.    Physical Exam Triage Vital Signs ED Triage Vitals  Enc Vitals Group     BP 03/15/19 1610 (!) 128/94     Pulse Rate 03/15/19 1610 (!) 59     Resp 03/15/19 1610 18     Temp 03/15/19 1610 98 F (36.7 C)     Temp Source 03/15/19 1610 Oral     SpO2 03/15/19 1610 99 %     Weight 03/15/19 1606 180 lb (81.6 kg)     Height 03/15/19 1606 5\' 9"  (1.753 m)  Head Circumference --      Peak Flow --      Pain Score 03/15/19 1606 0     Pain Loc --      Pain Edu? --      Excl. in [place]? --    Updated Vital Signs BP (!) 128/94 (BP Location: Right Arm)   Pulse (!) 59   Temp 98 F (36.7 C) (Oral)   Resp 18   Ht 5\' 9"  (1.753 m)   Wt 81.6 kg   SpO2 99%   BMI 26.58 kg/m   Visual Acuity Right Eye Distance:   Left Eye Distance:   Bilateral Distance:    Right Eye Near:   Left Eye Near:    Bilateral Near:     Physical Exam Vitals and nursing note reviewed.  Constitutional:      General: He is not in acute distress.    Appearance: Normal appearance. He is not ill-appearing.  HENT:     Head: Normocephalic and atraumatic.  Eyes:     General:        Right  eye: No discharge.        Left eye: No discharge.     Conjunctiva/sclera: Conjunctivae normal.  Cardiovascular:     Rate and Rhythm: Regular rhythm. Bradycardia present.     Heart sounds: No murmur.  Pulmonary:     Effort: Pulmonary effort is normal.     Breath sounds: Normal breath sounds. No wheezing, rhonchi or rales.  Skin:    Comments: Incision site to left medial, proximal leg appears to be healing well.  No evidence of infection at this time.  Neurological:     Mental Status: He is alert.  Psychiatric:        Mood and Affect: Mood normal.        Behavior: Behavior normal.    UC Treatments / Results  Labs (all labs ordered are listed, but only abnormal results are displayed) Labs Reviewed - No data to display  EKG   Radiology US Venous Img Lower Bilateral (DVT)  Result Date: 03/15/2019 CLINICAL DATA:  34 year old male with a history of left lower extremity swelling after surgery EXAM: BILATERAL LOWER EXTREMITY VENOUS DOPPLER ULTRASOUND TECHNIQUE: Gray-scale sonography with graded compression, as well as color Doppler and duplex ultrasound were performed to evaluate the lower extremity deep venous systems from the level of the common femoral vein and including the common femoral, femoral, profunda femoral, popliteal and calf veins including the posterior tibial, peroneal and gastrocnemius veins when visible. The superficial great saphenous vein was also interrogated. Spectral Doppler was utilized to evaluate flow at rest and with distal augmentation maneuvers in the common femoral, femoral and popliteal veins. COMPARISON:  None. FINDINGS: RIGHT LOWER EXTREMITY Common Femoral Vein: No evidence of thrombus. Normal compressibility, respiratory phasicity and response to augmentation. Saphenofemoral Junction: No evidence of thrombus. Normal compressibility and flow on color Doppler imaging. Profunda Femoral Vein: No evidence of thrombus. Normal compressibility and flow on color Doppler  imaging. Femoral Vein: No evidence of thrombus. Normal compressibility, respiratory phasicity and response to augmentation. Popliteal Vein: No evidence of thrombus. Normal compressibility, respiratory phasicity and response to augmentation. Calf Veins: No evidence of thrombus. Normal compressibility and flow on color Doppler imaging. Superficial Great Saphenous Vein: No evidence of thrombus. Normal compressibility and flow on color Doppler imaging. Other Findings:  None. LEFT LOWER EXTREMITY Common Femoral Vein: No evidence of thrombus. Normal compressibility, respiratory phasicity and response to augmentation. Saphenofemoral Junction: No  evidence of thrombus. Normal compressibility and flow on color Doppler imaging. Profunda Femoral Vein: No evidence of thrombus. Normal compressibility and flow on color Doppler imaging. Femoral Vein: No evidence of thrombus. Normal compressibility, respiratory phasicity and response to augmentation. Popliteal Vein: No evidence of thrombus. Normal compressibility, respiratory phasicity and response to augmentation. Calf Veins: No evidence of thrombus. Normal compressibility and flow on color Doppler imaging. Superficial Great Saphenous Vein: No evidence of thrombus. Normal compressibility and flow on color Doppler imaging. Other Findings: Borderline enlarged lymph node in the left inguinal region with typical architecture maintained. IMPRESSION: Sonographic survey of the bilateral lower extremities negative for DVT Enlarged left inguinal lymph node. Correlation with patient's presentation recommended. Electronically Signed   By: Corrie Mckusick D.O.   On: 03/15/2019 15:52    Procedures Procedures (including critical care time)  Medications Ordered in UC Medications - No data to display  Initial Impression / Assessment and Plan / UC Course  I have reviewed the triage vital signs and the nursing notes.  Pertinent labs & imaging results that were available during my care of  the patient were reviewed by me and considered in my medical decision making (see chart for details).    34 year old male presents with shortness of breath.  Wells score 4.5.  History of polycythemia.  Concern for pulmonary embolus.  CT angio obtained and was negative.  Advised supportive care and close follow-up with primary care physician and surgeon.  Final Clinical Impressions(s) / UC Diagnoses   Final diagnoses:  SOB (shortness of breath)     Discharge Instructions     Go to [facility], medical mall.  Take care  Dr. Lacinda Axon    ED Prescriptions    None     PDMP not reviewed this encounter.   Coral Spikes, [place] 03/15/19 1826

## 2019-03-15 NOTE — ED Triage Notes (Signed)
Pt c/o SOB. He states that he had a biopsy on left inner thigh last week and he started having pain in his lower leg. He had a U/s done today and was negative for DVT. He started having shortness of breath about 2 days ago. Denies chest pain.

## 2019-03-15 NOTE — Discharge Instructions (Signed)
Go to [facility], medical mall.  Take care  Dr. Lacinda Axon

## 2019-03-16 NOTE — ED Notes (Signed)
Dr. Lacinda Axon called for peer to peer.  Prior auth ref [id number]

## 2019-05-04 ENCOUNTER — Other Ambulatory Visit: Payer: Self-pay | Admitting: General Surgery

## 2019-07-05 ENCOUNTER — Other Ambulatory Visit
Admission: RE | Admit: 2019-07-05 | Discharge: 2019-07-05 | Disposition: A | Discharge status: Home / Self Care | Payer: BC Managed Care – PPO | Source: Ambulatory Visit | Attending: General Surgery | Admitting: General Surgery

## 2019-07-05 ENCOUNTER — Other Ambulatory Visit: Payer: Self-pay

## 2019-07-05 DIAGNOSIS — Z01812 Encounter for preprocedural laboratory examination: Secondary | ICD-10-CM | POA: Insufficient documentation

## 2019-07-05 DIAGNOSIS — Z20822 Contact with and (suspected) exposure to covid-19: Secondary | ICD-10-CM | POA: Insufficient documentation

## 2019-07-05 LAB — SARS CORONAVIRUS 2 (TAT 6-24 HRS): SARS Coronavirus 2: NEGATIVE

## 2019-07-06 ENCOUNTER — Encounter: Payer: Self-pay | Admitting: General Surgery

## 2019-07-07 ENCOUNTER — Other Ambulatory Visit: Payer: Self-pay

## 2019-07-07 ENCOUNTER — Ambulatory Visit: Payer: BC Managed Care – PPO | Admitting: Certified Registered"

## 2019-07-07 ENCOUNTER — Ambulatory Visit
Admission: RE | Admit: 2019-07-07 | Discharge: 2019-07-07 | Disposition: A | Discharge status: Home / Self Care | Payer: BC Managed Care – PPO | Attending: General Surgery | Admitting: General Surgery

## 2019-07-07 ENCOUNTER — Encounter: Payer: Self-pay | Admitting: General Surgery

## 2019-07-07 ENCOUNTER — Encounter
Admission: RE | Disposition: A | Discharge status: Home / Self Care | Payer: Self-pay | Source: Home / Self Care | Attending: General Surgery

## 2019-07-07 DIAGNOSIS — Z6826 Body mass index (BMI) 26.0-26.9, adult: Secondary | ICD-10-CM | POA: Insufficient documentation

## 2019-07-07 DIAGNOSIS — D123 Benign neoplasm of transverse colon: Secondary | ICD-10-CM | POA: Insufficient documentation

## 2019-07-07 DIAGNOSIS — E669 Obesity, unspecified: Secondary | ICD-10-CM | POA: Insufficient documentation

## 2019-07-07 DIAGNOSIS — Z8371 Family history of colonic polyps: Secondary | ICD-10-CM | POA: Insufficient documentation

## 2019-07-07 DIAGNOSIS — Z1211 Encounter for screening for malignant neoplasm of colon: Secondary | ICD-10-CM | POA: Insufficient documentation

## 2019-07-07 DIAGNOSIS — Z7982 Long term (current) use of aspirin: Secondary | ICD-10-CM | POA: Diagnosis not present

## 2019-07-07 DIAGNOSIS — F4310 Post-traumatic stress disorder, unspecified: Secondary | ICD-10-CM | POA: Diagnosis not present

## 2019-07-07 DIAGNOSIS — D124 Benign neoplasm of descending colon: Secondary | ICD-10-CM | POA: Insufficient documentation

## 2019-07-07 DIAGNOSIS — F419 Anxiety disorder, unspecified: Secondary | ICD-10-CM | POA: Insufficient documentation

## 2019-07-07 HISTORY — PX: ESOPHAGOGASTRODUODENOSCOPY (EGD) WITH PROPOFOL: SHX5813

## 2019-07-07 HISTORY — PX: COLONOSCOPY WITH PROPOFOL: SHX5780

## 2019-07-07 SURGERY — COLONOSCOPY WITH PROPOFOL
Anesthesia: General

## 2019-07-07 MED ORDER — ESMOLOL HCL 100 MG/10ML IV SOLN
INTRAVENOUS | Status: AC
  Filled 2019-07-07: qty 10

## 2019-07-07 MED ORDER — GLYCOPYRROLATE 0.2 MG/ML IJ SOLN
INTRAMUSCULAR | Status: AC
  Filled 2019-07-07: qty 2

## 2019-07-07 MED ORDER — VASOPRESSIN 20 UNIT/ML IV SOLN
INTRAVENOUS | Status: AC
  Filled 2019-07-07: qty 1

## 2019-07-07 MED ORDER — PROPOFOL 10 MG/ML IV BOLUS
INTRAVENOUS | Status: DC | PRN
  Administered 2019-07-07: 10 mg via INTRAVENOUS
  Administered 2019-07-07: 40 mg via INTRAVENOUS

## 2019-07-07 MED ORDER — LIDOCAINE HCL (PF) 2 % IJ SOLN
INTRAMUSCULAR | Status: AC
  Filled 2019-07-07: qty 25

## 2019-07-07 MED ORDER — PHENYLEPHRINE HCL (PRESSORS) 10 MG/ML IV SOLN
INTRAVENOUS | Status: AC
  Filled 2019-07-07: qty 1

## 2019-07-07 MED ORDER — MIDAZOLAM HCL 2 MG/2ML IJ SOLN
INTRAMUSCULAR | Status: AC
  Filled 2019-07-07: qty 2

## 2019-07-07 MED ORDER — LIDOCAINE HCL (CARDIAC) PF 100 MG/5ML IV SOSY
PREFILLED_SYRINGE | INTRAVENOUS | Status: DC | PRN
  Administered 2019-07-07: 100 mg via INTRAVENOUS

## 2019-07-07 MED ORDER — SODIUM CHLORIDE 0.9 % IV SOLN: INTRAVENOUS | Status: DC

## 2019-07-07 MED ORDER — PROPOFOL 500 MG/50ML IV EMUL
INTRAVENOUS | Status: AC
  Filled 2019-07-07: qty 400

## 2019-07-07 MED ORDER — PROPOFOL 500 MG/50ML IV EMUL
INTRAVENOUS | Status: DC | PRN
  Administered 2019-07-07: 165 ug/kg/min via INTRAVENOUS

## 2019-07-07 MED ORDER — MIDAZOLAM HCL 2 MG/2ML IJ SOLN
INTRAMUSCULAR | Status: DC | PRN
  Administered 2019-07-07: 2 mg via INTRAVENOUS

## 2019-07-07 MED ORDER — GLYCOPYRROLATE 0.2 MG/ML IJ SOLN
INTRAMUSCULAR | Status: DC | PRN
  Administered 2019-07-07: .2 mg via INTRAVENOUS

## 2019-07-07 NOTE — Anesthesia Postprocedure Evaluation (Signed)
Anesthesia Post Note  Patient: John Davis  Procedure(s) Performed: COLONOSCOPY WITH PROPOFOL (N/A ) ESOPHAGOGASTRODUODENOSCOPY (EGD) WITH PROPOFOL (N/A )  Patient location during evaluation: Endoscopy Anesthesia Type: General Level of consciousness: awake and alert and oriented Pain management: pain level controlled Vital Signs Assessment: post-procedure vital signs reviewed and stable Respiratory status: spontaneous breathing, nonlabored ventilation and respiratory function stable Cardiovascular status: blood pressure returned to baseline and stable Postop Assessment: no signs of nausea or vomiting Anesthetic complications: no     Last Vitals:  Vitals:   07/07/19 0842 07/07/19 0852  BP: (!) 117/98 122/85  Pulse: (!) 54 63  Resp: 16 18  Temp:    SpO2: 99% 100%    Last Pain:  Vitals:   07/07/19 0812  TempSrc: Temporal                 Arista Kettlewell

## 2019-07-07 NOTE — Transfer of Care (Signed)
Immediate Anesthesia Transfer of Care Note  Patient: John Davis  Procedure(s) Performed: COLONOSCOPY WITH PROPOFOL (N/A ) ESOPHAGOGASTRODUODENOSCOPY (EGD) WITH PROPOFOL (N/A )  Patient Location: Endoscopy Unit  Anesthesia Type:General  Level of Consciousness: drowsy and responds to stimulation  Airway & Oxygen Therapy: Patient Spontanous Breathing and Patient connected to face mask oxygen  Post-op Assessment: Report given to RN and Post -op Vital signs reviewed and stable  Post vital signs: Reviewed and stable  Last Vitals:  Vitals Value Taken Time  BP 107/73 07/07/19 0812  Temp    Pulse 62 07/07/19 0813  Resp 14 07/07/19 0813  SpO2 97 % 07/07/19 0813  Vitals shown include unvalidated device data.  Last Pain: There were no vitals filed for this visit.       Complications: No apparent anesthesia complications

## 2019-07-07 NOTE — Op Note (Signed)
[facility] Gastroenterology Patient Name: John Davis Procedure Date: 07/07/2019 7:33 AM MRN: [id number] Account #: [id number] Date of Birth: Feb 01, 1986 Admit Type: Outpatient Age: 34 Room: [facility] ENDO ROOM 1 Gender: Male Note Status: Finalized Procedure:             Colonoscopy Indications:           Family history of familial adenomatous polyposis in a                         first-degree relative Providers:             Robert Bellow, MD Medicines:             Monitored Anesthesia Care Complications:         No immediate complications. Procedure:             Pre-Anesthesia Assessment:                        - Prior to the procedure, a History and Physical was                         performed, and patient medications, allergies and                         sensitivities were reviewed. The patient's tolerance                         of previous anesthesia was reviewed.                        - The risks and benefits of the procedure and the                         sedation options and risks were discussed with the                         patient. All questions were answered and informed                         consent was obtained.                        After obtaining informed consent, the colonoscope was                         passed under direct vision. Throughout the procedure,                         the patient's blood pressure, pulse, and oxygen                         saturations were monitored continuously. The                         Colonoscope was introduced through the anus and                         advanced to the the terminal ileum. The colonoscopy  was performed without difficulty. The patient                         tolerated the procedure well. The quality of the bowel                         preparation was excellent. Findings:      Multiple sessile polyps were found in the descending colon, mid   descending colon, transverse colon and mid transverse colon. The polyps       were 5 mm in size. These were biopsied with a cold large-capacity       forceps for histology.      The retroflexed view of the distal rectum and anal verge was normal and       showed no anal or rectal abnormalities. Impression:            - Multiple 5 mm polyps in the descending colon, in the                         mid descending colon, in the transverse colon and in                         the mid transverse colon. Biopsied.                        - The distal rectum and anal verge are normal on                         retroflexion view. Recommendation:        - Repeat colonoscopy in 3 years for surveillance.                        - Telephone endoscopist for pathology results in 1                         week. Procedure Code(s):     --- Professional ---                        [phone], Colonoscopy, flexible; with biopsy, single or                         multiple Diagnosis Code(s):     --- Professional ---                        K63.5, Polyp of colon                        Z83.71, Family history of colonic polyps CPT copyright 2019 American Medical Association. All rights reserved. The codes documented in this report are preliminary and upon coder review may  be revised to meet current compliance requirements. Robert Bellow, MD 07/07/2019 8:11:39 AM This report has been signed electronically. Number of Addenda: 0 Note Initiated On: 07/07/2019 7:33 AM Scope Withdrawal Time: 0 hours 12 minutes 55 seconds  Total Procedure Duration: 0 hours 16 minutes 8 seconds       [facility]

## 2019-07-07 NOTE — H&P (Addendum)
RAYSHOD WALTMAN [id number] [birth year]     HPI: 34 y/o male with history of familial polyposis syndrome. For colonoscopy. History of significant reflux symptoms. For EGD.   Medications Prior to Admission  Medication Sig Dispense Refill Last Dose  . aspirin 325 MG EC tablet SMARTSIG:1 Tablet(s) By Mouth Daily   05/11/2019  . doxycycline (MONODOX) 100 MG capsule Take 100 mg by mouth 2 (two) times daily.   Completed Course at Unknown time   No Known Allergies Past Medical History:  Diagnosis Date  . Polycythemia 2017   high hemoglobin  . PTSD (post-traumatic stress disorder)   . Vitamin B12 deficiency 11/08/2015   Past Surgical History:  Procedure Laterality Date  . COLONOSCOPY W/ POLYPECTOMY  [phone]   Dr. Bary Castilla  . COLONOSCOPY WITH PROPOFOL N/A 12/26/2015   Procedure: COLONOSCOPY WITH PROPOFOL;  Surgeon: Robert Bellow, MD;  Location[facility] ENDOSCOPY;  Service: Endoscopy;  Laterality: N/A;  . mass removed from left thigh     Social History   Socioeconomic History  . Marital status: Single    Spouse name: Not on file  . Number of children: Not on file  . Years of education: Not on file  . Highest education level: Not on file  Occupational History  . Occupation: fireman  Tobacco Use  . Smoking status: Never Smoker  . Smokeless tobacco: Never Used  Substance and Sexual Activity  . Alcohol use: No  . Drug use: No  . Sexual activity: Not on file  Other Topics Concern  . Not on file  Social History Narrative  . Not on file   Social Determinants of Health   Financial Resource Strain:   . Difficulty of Paying Living Expenses:   Food Insecurity:   . Worried About Charity fundraiser in the Last Year:   . Arboriculturist in the Last Year:   Transportation Needs:   . Film/video editor (Medical):   Marland Kitchen Lack of Transportation (Non-Medical):   Physical Activity:   . Days of Exercise per Week:   . Minutes of Exercise per Session:   Stress:   . Feeling of  Stress :   Social Connections:   . Frequency of Communication with Friends and Family:   . Frequency of Social Gatherings with Friends and Family:   . Attends Religious Services:   . Active Member of Clubs or Organizations:   . Attends Archivist Meetings:   Marland Kitchen Marital Status:   Intimate Partner Violence:   . Fear of Current or Ex-Partner:   . Emotionally Abused:   Marland Kitchen Physically Abused:   . Sexually Abused:    Social History   Social History Narrative  . Not on file     ROS: Negative.     PE: HEENT: Negative. Lungs: Clear. Cardio: RR.   Assessment/Plan:  Proceed with planned endoscopy.   Forest Gleason [facility] 07/07/2019

## 2019-07-07 NOTE — Op Note (Signed)
[facility] Gastroenterology Patient Name: John Davis Procedure Date: 07/07/2019 7:34 AM MRN: [id number] Account #: [id number] Date of Birth: [birth year] Admit Type: Outpatient Age: 34 Room: [facility] ENDO ROOM 1 Gender: Male Note Status: Finalized Procedure:             Upper GI endoscopy Indications:           Suspected esophageal reflux Providers:             Robert Bellow, MD Referring MD:          Ramonita Lab, MD (Referring MD) Medicines:             Monitored Anesthesia Care Complications:         No immediate complications. Procedure:             Pre-Anesthesia Assessment:                        - Prior to the procedure, a History and Physical was                         performed, and patient medications, allergies and                         sensitivities were reviewed. The patient's tolerance                         of previous anesthesia was reviewed.                        - The risks and benefits of the procedure and the                         sedation options and risks were discussed with the                         patient. All questions were answered and informed                         consent was obtained.                        After obtaining informed consent, the endoscope was                         passed under direct vision. Throughout the procedure,                         the patient's blood pressure, pulse, and oxygen                         saturations were monitored continuously. The Endoscope                         was introduced through the mouth, and advanced to the                         second part of duodenum. The upper GI endoscopy was  accomplished without difficulty. The patient tolerated                         the procedure well. Findings:      The esophagus was normal.      The stomach was normal.      The examined duodenum was normal. Impression:            - Normal esophagus.         - Normal stomach.                        - Normal examined duodenum.                        - No specimens collected. Recommendation:        - Perform a colonoscopy today. Procedure Code(s):     --- Professional ---                        [phone], Esophagogastroduodenoscopy, flexible,                         transoral; diagnostic, including collection of                         specimen(s) by brushing or washing, when performed                         (separate procedure) CPT copyright 2019 American Medical Association. All rights reserved. The codes documented in this report are preliminary and upon coder review may  be revised to meet current compliance requirements. Robert Bellow, MD 07/07/2019 7:50:49 AM This report has been signed electronically. Number of Addenda: 0 Note Initiated On: 07/07/2019 7:34 AM Estimated Blood Loss:  Estimated blood loss: none.      [facility]

## 2019-07-07 NOTE — Anesthesia Preprocedure Evaluation (Signed)
Anesthesia Evaluation  Patient identified by MRN, date of birth, ID band Patient awake    Reviewed: Allergy & Precautions, NPO status , Patient's Chart, lab work & pertinent test results  History of Anesthesia Complications Negative for: history of anesthetic complications  Airway Mallampati: I  TM Distance: >3 FB Neck ROM: Full    Dental  (+) Poor Dentition   Pulmonary neg pulmonary ROS, neg sleep apnea, neg COPD,    breath sounds clear to auscultation- rhonchi (-) wheezing      Cardiovascular Exercise Tolerance: Good (-) hypertension(-) CAD and (-) Past MI  Rhythm:Regular Rate:Normal - Systolic murmurs and - Diastolic murmurs    Neuro/Psych  Headaches, PSYCHIATRIC DISORDERS Anxiety    GI/Hepatic negative GI ROS, Neg liver ROS,   Endo/Other  negative endocrine ROSneg diabetes  Renal/GU negative Renal ROS     Musculoskeletal negative musculoskeletal ROS (+)   Abdominal (+) - obese,   Peds  Hematology negative hematology ROS (+)   Anesthesia Other Findings Past Medical History: 2017: Polycythemia     Comment:  high hemoglobin No date: PTSD (post-traumatic stress disorder) 11/08/2015: Vitamin B12 deficiency   Reproductive/Obstetrics                             Anesthesia Physical Anesthesia Plan  ASA: II  Anesthesia Plan: General   Post-op Pain Management:    Induction: Intravenous  PONV Risk Score and Plan: 1 and Propofol infusion  Airway Management Planned: Natural Airway  Additional Equipment:   Intra-op Plan:   Post-operative Plan:   Informed Consent: I have reviewed the patients History and Physical, chart, labs and discussed the procedure including the risks, benefits and alternatives for the proposed anesthesia with the patient or authorized representative who has indicated his/her understanding and acceptance.     Dental advisory given  Plan Discussed with: CRNA  and Anesthesiologist  Anesthesia Plan Comments:         Anesthesia Quick Evaluation

## 2019-07-08 ENCOUNTER — Encounter: Payer: Self-pay | Admitting: *Deleted

## 2019-07-08 LAB — SURGICAL PATHOLOGY

## 2019-09-23 ENCOUNTER — Other Ambulatory Visit: Payer: Self-pay

## 2019-09-23 ENCOUNTER — Ambulatory Visit
Admission: RE | Admit: 2019-09-23 | Discharge: 2019-09-23 | Disposition: A | Discharge status: Home / Self Care | Payer: BC Managed Care – PPO | Source: Ambulatory Visit | Attending: Internal Medicine | Admitting: Internal Medicine

## 2019-09-23 DIAGNOSIS — D751 Secondary polycythemia: Secondary | ICD-10-CM | POA: Diagnosis present

## 2019-12-13 ENCOUNTER — Other Ambulatory Visit
Admission: RE | Admit: 2019-12-13 | Discharge: 2019-12-13 | Disposition: A | Discharge status: Home / Self Care | Payer: BC Managed Care – PPO | Source: Ambulatory Visit | Attending: Internal Medicine | Admitting: Internal Medicine

## 2019-12-13 DIAGNOSIS — R0602 Shortness of breath: Secondary | ICD-10-CM | POA: Insufficient documentation

## 2019-12-13 DIAGNOSIS — R0902 Hypoxemia: Secondary | ICD-10-CM | POA: Diagnosis not present

## 2019-12-13 LAB — TROPONIN I (HIGH SENSITIVITY): Troponin I (High Sensitivity): 4 ng/L (ref <18)

## 2020-01-21 ENCOUNTER — Ambulatory Visit
Admission: RE | Admit: 2020-01-21 | Discharge: 2020-01-21 | Disposition: A | Discharge status: Home / Self Care | Payer: BC Managed Care – PPO | Source: Ambulatory Visit | Attending: Internal Medicine | Admitting: Internal Medicine

## 2020-01-21 ENCOUNTER — Other Ambulatory Visit: Payer: Self-pay

## 2020-01-21 DIAGNOSIS — D751 Secondary polycythemia: Secondary | ICD-10-CM | POA: Insufficient documentation

## 2020-08-16 ENCOUNTER — Ambulatory Visit: Payer: BC Managed Care – PPO | Admitting: Dermatology

## 2020-08-17 ENCOUNTER — Ambulatory Visit: Payer: BC Managed Care – PPO | Admitting: Dermatology

## 2020-08-17 ENCOUNTER — Other Ambulatory Visit: Payer: Self-pay

## 2020-08-17 DIAGNOSIS — B079 Viral wart, unspecified: Secondary | ICD-10-CM | POA: Diagnosis not present

## 2020-08-17 NOTE — Patient Instructions (Addendum)
If you have any questions or concerns for your doctor, please call our main line at [phone] and press option 4 to reach your doctor's medical assistant. If no one answers, please leave a voicemail as directed and we will return your call as soon as possible. Messages left after 4 pm will be answered the following business day.   You may also send [place] a message via [place]. We typically respond to MyChart messages within 1-2 business days.  For prescription refills, please ask your pharmacy to contact our office. Our fax number is [phone].  If you have an urgent issue when the clinic is closed that cannot wait until the next business day, you can page your doctor at the number below.    Please note that while we do our best to be available for urgent issues outside of office hours, we are not available 24/7.   If you have an urgent issue and are unable to reach [place], you may choose to seek medical care at your doctor's office, retail clinic, urgent care center, or emergency room.  If you have a medical emergency, please immediately call 911 or go to the emergency department.  Pager Numbers  - Dr. Nehemiah Massed: [phone]  - Dr. Laurence Ferrari: [phone]  - Dr. Nicole Kindred: [phone]  In the event of inclement weather, please call our main line at [phone] for an update on the status of any delays or closures.  Dermatology Medication Tips: Please keep the boxes that topical medications come in in order to help keep track of the instructions about where and how to use these. Pharmacies typically print the medication instructions only on the boxes and not directly on the medication tubes.   If your medication is too expensive, please contact our office at [phone] option 4 or send [place] a message through [place].   We are unable to tell what your co-pay for medications will be in advance as this is different depending on your insurance coverage. However, we may be able to find a substitute  medication at lower cost or fill out paperwork to get insurance to cover a needed medication.   If a prior authorization is required to get your medication covered by your insurance company, please allow [place] 1-2 business days to complete this process.  Drug prices often vary depending on where the prescription is filled and some pharmacies may offer cheaper prices.  The website www.goodrx.com contains coupons for medications through different pharmacies. The prices here do not account for what the cost may be with help from insurance (it may be cheaper with your insurance), but the website can give you the price if you did not use any insurance.  - You can print the associated coupon and take it with your prescription to the pharmacy.  - You may also stop by our office during regular business hours and pick up a GoodRx coupon card.  - If you need your prescription sent electronically to a different pharmacy, notify our office through [facility] or by phone at [phone] option 4.   Viral Warts & Molluscum Contagiosum  Viral warts and molluscum contagiosum are growths of the skin caused by viral infection of the skin. If you have been given the diagnosis of viral warts or molluscum contagiosum there are a few things that you must understand about your condition:  There is no guaranteed treatment method available for this condition. Multiple treatments may be required, The treatments may be time consuming and require multiple visits  to the dermatology office. The treatment may be expensive. You will be charged each time you come into the office to have the spots treated. The treated areas may develop new lesions further complicating treatment. The treated areas may leave a scar. There is no guarantee that even after multiple treatments that the spots will be successfully treated. These are caused by a viral infection and can be spread to other areas of the skin and to other people by  direct contact. Therefore, new spots may occur.

## 2020-08-17 NOTE — Progress Notes (Signed)
   New Patient Visit  Subjective  John Davis is a 35 y.o. male who presents for the following: warts (On the B/L hands x 1 year - had one treated in the past with LN2 and it resolved).  The following portions of the chart were reviewed this encounter and updated as appropriate:       Review of Systems:  No other skin or systemic complaints except as noted in HPI or Assessment and Plan.  Objective  Well appearing patient in no apparent distress; mood and affect are within normal limits.  A focused examination was performed including the B/L hands. Relevant physical exam findings are noted in the Assessment and Plan.  L palm x 2, palmar L 3rd finger x 2, L 4th finger x 1, L index finger x 1, R palm x 1, R thumb x 3, R 3rd finger x 1 (11) Verrucous papules -- Discussed viral etiology and contagion.   Assessment & Plan  Viral warts, unspecified type (11) L palm x 2, palmar L 3rd finger x 2, L 4th finger x 1, L index finger x 1, R palm x 1, R thumb x 3, R 3rd finger x 1  Discussed viral etiology and risk of spread.  Discussed multiple treatments may be required to clear warts.  Discussed possible post-treatment dyspigmentation and risk of recurrence.  Destruction of lesion - L palm x 2, palmar L 3rd finger x 2, L 4th finger x 1, L index finger x 1, R palm x 1, R thumb x 3, R 3rd finger x 1  Destruction method: cryotherapy   Informed consent: discussed and consent obtained   Lesion destroyed using liquid nitrogen: Yes   Region frozen until ice ball extended beyond lesion: Yes   Outcome: patient tolerated procedure well with no complications   Post-procedure details: wound care instructions given    Destruction of lesion - L palm x 2, palmar L 3rd finger x 2, L 4th finger x 1, L index finger x 1, R palm x 1, R thumb x 3, R 3rd finger x 1  Destruction method: chemical removal   Informed consent: discussed and consent obtained   Chemical destruction method: cantharidin    Application time:  6 hours Procedure instructions: patient instructed to wash and dry area   Outcome: patient tolerated procedure well with no complications    Return for wart follow up in 4 weeks.  Luther Redo, CMA, am acting as scribe for Brendolyn Patty, MD .  Documentation: I have reviewed the above documentation for accuracy and completeness, and I agree with the above.  Brendolyn Patty MD

## 2020-08-22 ENCOUNTER — Ambulatory Visit
Admission: RE | Admit: 2020-08-22 | Discharge: 2020-08-22 | Disposition: A | Discharge status: Home / Self Care | Payer: BC Managed Care – PPO | Source: Ambulatory Visit | Attending: Internal Medicine | Admitting: Internal Medicine

## 2020-08-22 ENCOUNTER — Other Ambulatory Visit: Payer: Self-pay

## 2020-08-22 DIAGNOSIS — D751 Secondary polycythemia: Secondary | ICD-10-CM | POA: Insufficient documentation

## 2020-08-22 LAB — HEMOGLOBIN: Hemoglobin: 16.8 g/dL (ref 13.0–17.0)

## 2020-10-19 ENCOUNTER — Other Ambulatory Visit (HOSPITAL_COMMUNITY): Payer: Self-pay | Admitting: Internal Medicine

## 2020-10-19 ENCOUNTER — Other Ambulatory Visit: Payer: Self-pay | Admitting: Internal Medicine

## 2020-10-19 DIAGNOSIS — R1013 Epigastric pain: Secondary | ICD-10-CM

## 2020-11-01 ENCOUNTER — Other Ambulatory Visit: Payer: Self-pay

## 2020-11-01 ENCOUNTER — Ambulatory Visit
Admission: RE | Admit: 2020-11-01 | Discharge: 2020-11-01 | Disposition: A | Discharge status: Home / Self Care | Payer: BC Managed Care – PPO | Source: Ambulatory Visit | Attending: Internal Medicine | Admitting: Internal Medicine

## 2020-11-01 DIAGNOSIS — R1013 Epigastric pain: Secondary | ICD-10-CM

## 2020-11-16 ENCOUNTER — Other Ambulatory Visit: Payer: Self-pay | Admitting: Internal Medicine

## 2020-11-16 DIAGNOSIS — R1084 Generalized abdominal pain: Secondary | ICD-10-CM

## 2020-12-07 ENCOUNTER — Ambulatory Visit
Admission: RE | Admit: 2020-12-07 | Discharge: 2020-12-07 | Disposition: A | Discharge status: Home / Self Care | Payer: BC Managed Care – PPO | Source: Ambulatory Visit | Attending: Internal Medicine | Admitting: Internal Medicine

## 2020-12-07 ENCOUNTER — Other Ambulatory Visit: Payer: Self-pay

## 2020-12-07 DIAGNOSIS — R1084 Generalized abdominal pain: Secondary | ICD-10-CM | POA: Diagnosis not present

## 2020-12-07 MED ORDER — IOHEXOL 350 MG/ML SOLN
85.0000 mL | Freq: Once | INTRAVENOUS | Status: AC | PRN
  Administered 2020-12-07: 85 mL via INTRAVENOUS

## 2020-12-27 ENCOUNTER — Other Ambulatory Visit: Payer: Self-pay | Admitting: Internal Medicine

## 2020-12-27 DIAGNOSIS — R2242 Localized swelling, mass and lump, left lower limb: Secondary | ICD-10-CM

## 2020-12-27 DIAGNOSIS — R161 Splenomegaly, not elsewhere classified: Secondary | ICD-10-CM

## 2021-01-09 ENCOUNTER — Other Ambulatory Visit: Payer: Self-pay

## 2021-01-09 ENCOUNTER — Ambulatory Visit
Admission: RE | Admit: 2021-01-09 | Discharge: 2021-01-09 | Disposition: A | Discharge status: Home / Self Care | Payer: BC Managed Care – PPO | Source: Ambulatory Visit | Attending: Internal Medicine | Admitting: Internal Medicine

## 2021-01-09 DIAGNOSIS — Z08 Encounter for follow-up examination after completed treatment for malignant neoplasm: Secondary | ICD-10-CM | POA: Diagnosis not present

## 2021-01-09 DIAGNOSIS — R161 Splenomegaly, not elsewhere classified: Secondary | ICD-10-CM

## 2021-01-09 DIAGNOSIS — R918 Other nonspecific abnormal finding of lung field: Secondary | ICD-10-CM | POA: Insufficient documentation

## 2021-01-09 DIAGNOSIS — R2242 Localized swelling, mass and lump, left lower limb: Secondary | ICD-10-CM

## 2021-01-09 LAB — GLUCOSE, CAPILLARY: Glucose-Capillary: 88 mg/dL (ref 70–99)

## 2021-01-09 MED ORDER — FLUDEOXYGLUCOSE F - 18 (FDG) INJECTION
9.7400 | Freq: Once | INTRAVENOUS | Status: AC | PRN
  Administered 2021-01-09: 9.74 via INTRAVENOUS

## 2021-01-16 ENCOUNTER — Other Ambulatory Visit: Payer: Self-pay | Admitting: General Surgery

## 2021-01-16 NOTE — Progress Notes (Signed)
PET positive left inguinal node. For excision.

## 2021-01-29 ENCOUNTER — Other Ambulatory Visit: Payer: Self-pay

## 2021-01-29 ENCOUNTER — Other Ambulatory Visit
Admission: RE | Admit: 2021-01-29 | Discharge: 2021-01-29 | Disposition: A | Discharge status: Home / Self Care | Payer: BC Managed Care – PPO | Source: Ambulatory Visit | Attending: General Surgery | Admitting: General Surgery

## 2021-01-29 NOTE — Patient Instructions (Signed)
Your procedure is scheduled on: Friday February 09, 2021. Report to Day Surgery inside [place] 2nd floor stop by admissions desk before getting on elevator.  To find out your arrival time please call [phone] between 1PM - 3PM on Thursday February 08, 2021.  Remember: Instructions that are not followed completely may result in serious medical risk,  up to and including death, or upon the discretion of your surgeon and anesthesiologist your  surgery may need to be rescheduled.     _X__ 1. Do not eat food after midnight the night before your procedure.                 No chewing gum or hard candies. You may drink clear liquids up to 2 hours                 before you are scheduled to arrive for your surgery- DO not drink clear                 liquids within 2 hours of the start of your surgery.                 Clear Liquids include:  water, apple juice without pulp, clear Gatorade, G2 or                  Gatorade Zero (avoid Red/Purple/Blue), Black Coffee or Tea (Do not add                 anything to coffee or tea).  __X__2.  On the morning of surgery brush your teeth with toothpaste and water, you                may rinse your mouth with mouthwash if you wish.  Do not swallow any toothpaste or mouthwash.     _X__ 3.  No Alcohol for 24 hours before or after surgery.   _X__ 4.  Do Not Smoke or use e-cigarettes For 24 Hours Prior to Your Surgery.                 Do not use any chewable tobacco products for at least 6 hours prior to                 Surgery.  _X__  5.  Do not use any recreational drugs (marijuana, cocaine, heroin, ecstasy, MDMA or other)                For at least one week prior to your surgery.  Combination of these drugs with anesthesia                May have life threatening results.  _X___6.  Notify your doctor if there is any change in your medical condition      (cold, fever, infections).     Do not wear jewelry, make-up, hairpins,  clips or nail polish. Do not wear lotions, powders, or perfumes. You may wear deodorant. Do not shave 48 hours prior to surgery. Men may shave face and neck. Do not bring valuables to the hospital.    [facility] is not responsible for any belongings or valuables.  Contacts, dentures or bridgework may not be worn into surgery. Leave your suitcase in the car. After surgery it may be brought to your room. For patients admitted to the hospital, discharge time is determined by your treatment team.   Patients discharged the day of surgery will not be allowed to drive home.  Make arrangements for someone to be with you for the first 24 hours of your Same Day Discharge.   __X__ Take these medicines the morning of surgery with A SIP OF WATER:    1. None   2.   3.   4.  5.  6.  ____ Fleet Enema (as directed)   __X__ Use  Antibacterial Soap (or wipes) as directed  ____ Use Benzoyl Peroxide Gel as instructed  ____ Use inhalers on the day of surgery  ____ Stop metformin 2 days prior to surgery    ____ Take 1/2 of usual insulin dose the night before surgery. No insulin the morning          of surgery.   ____ Call your PCP, cardiologist, or Pulmonologist if taking Coumadin/Plavix/aspirin and ask when to stop before your surgery.   __X__ One Week prior to surgery- Stop Anti-inflammatories such as Ibuprofen, Aleve, Advil, Motrin, meloxicam (MOBIC), diclofenac, etodolac, ketorolac, Toradol, Daypro, piroxicam, Goody's or BC powders. OK TO USE TYLENOL IF NEEDED   __X__ Do not start any new vitamins and or supplements until after surgery.    ____ Bring C-Pap to the hospital.    If you have any questions regarding your pre-procedure instructions,  Please call Pre-admit Testing at [phone]

## 2021-02-09 ENCOUNTER — Ambulatory Visit: Payer: BC Managed Care – PPO | Admitting: Registered Nurse

## 2021-02-09 ENCOUNTER — Ambulatory Visit
Admission: RE | Admit: 2021-02-09 | Discharge: 2021-02-09 | Disposition: A | Discharge status: Home / Self Care | Payer: BC Managed Care – PPO | Source: Ambulatory Visit | Attending: General Surgery | Admitting: General Surgery

## 2021-02-09 ENCOUNTER — Encounter: Payer: Self-pay | Admitting: General Surgery

## 2021-02-09 ENCOUNTER — Encounter
Admission: RE | Disposition: A | Discharge status: Home / Self Care | Payer: Self-pay | Source: Ambulatory Visit | Attending: General Surgery

## 2021-02-09 ENCOUNTER — Other Ambulatory Visit: Payer: Self-pay

## 2021-02-09 DIAGNOSIS — R591 Generalized enlarged lymph nodes: Secondary | ICD-10-CM | POA: Insufficient documentation

## 2021-02-09 DIAGNOSIS — E538 Deficiency of other specified B group vitamins: Secondary | ICD-10-CM | POA: Diagnosis not present

## 2021-02-09 HISTORY — PX: INGUINAL LYMPH NODE BIOPSY: SHX5865

## 2021-02-09 SURGERY — BIOPSY, LYMPH NODE, INGUINAL, OPEN
Anesthesia: General | Site: Groin | Laterality: Left

## 2021-02-09 MED ORDER — CHLORHEXIDINE GLUCONATE 0.12 % MT SOLN
OROMUCOSAL | Status: AC
  Administered 2021-02-09: 11:00:00 15 mL via OROMUCOSAL
  Filled 2021-02-09: qty 15

## 2021-02-09 MED ORDER — LIDOCAINE HCL (CARDIAC) PF 100 MG/5ML IV SOSY
PREFILLED_SYRINGE | INTRAVENOUS | Status: DC | PRN
  Administered 2021-02-09: 100 mg via INTRAVENOUS

## 2021-02-09 MED ORDER — PROPOFOL 10 MG/ML IV BOLUS
INTRAVENOUS | Status: DC | PRN
  Administered 2021-02-09: 200 mg via INTRAVENOUS

## 2021-02-09 MED ORDER — ONDANSETRON HCL 4 MG/2ML IJ SOLN
INTRAMUSCULAR | Status: AC
  Filled 2021-02-09: qty 2

## 2021-02-09 MED ORDER — FAMOTIDINE 20 MG PO TABS: 20.0000 mg | ORAL_TABLET | Freq: Once | ORAL | Status: AC

## 2021-02-09 MED ORDER — ONDANSETRON HCL 4 MG/2ML IJ SOLN: 4.0000 mg | Freq: Once | INTRAMUSCULAR | Status: DC | PRN

## 2021-02-09 MED ORDER — ORAL CARE MOUTH RINSE: 15.0000 mL | Freq: Once | OROMUCOSAL | Status: AC

## 2021-02-09 MED ORDER — CHLORHEXIDINE GLUCONATE CLOTH 2 % EX PADS: 6.0000 | MEDICATED_PAD | Freq: Once | CUTANEOUS | Status: DC

## 2021-02-09 MED ORDER — MIDAZOLAM HCL 2 MG/2ML IJ SOLN
INTRAMUSCULAR | Status: AC
  Filled 2021-02-09: qty 2

## 2021-02-09 MED ORDER — FENTANYL CITRATE (PF) 100 MCG/2ML IJ SOLN: 25.0000 ug | INTRAMUSCULAR | Status: DC | PRN

## 2021-02-09 MED ORDER — BUPIVACAINE HCL (PF) 0.5 % IJ SOLN
INTRAMUSCULAR | Status: AC
  Filled 2021-02-09: qty 30

## 2021-02-09 MED ORDER — PROPOFOL 10 MG/ML IV BOLUS
INTRAVENOUS | Status: AC
  Filled 2021-02-09: qty 20

## 2021-02-09 MED ORDER — FAMOTIDINE 20 MG PO TABS
ORAL_TABLET | ORAL | Status: AC
  Administered 2021-02-09: 11:00:00 20 mg via ORAL
  Filled 2021-02-09: qty 1

## 2021-02-09 MED ORDER — ONDANSETRON HCL 4 MG/2ML IJ SOLN
INTRAMUSCULAR | Status: DC | PRN
  Administered 2021-02-09: 4 mg via INTRAVENOUS

## 2021-02-09 MED ORDER — GLYCOPYRROLATE 0.2 MG/ML IJ SOLN
INTRAMUSCULAR | Status: AC
  Filled 2021-02-09: qty 1

## 2021-02-09 MED ORDER — CHLORHEXIDINE GLUCONATE 0.12 % MT SOLN: 15.0000 mL | Freq: Once | OROMUCOSAL | Status: AC

## 2021-02-09 MED ORDER — DEXAMETHASONE SODIUM PHOSPHATE 10 MG/ML IJ SOLN
INTRAMUSCULAR | Status: AC
  Filled 2021-02-09: qty 1

## 2021-02-09 MED ORDER — BUPIVACAINE-EPINEPHRINE (PF) 0.5% -1:200000 IJ SOLN
INTRAMUSCULAR | Status: DC | PRN
  Administered 2021-02-09: 10 mL via PERINEURAL

## 2021-02-09 MED ORDER — BUPIVACAINE-EPINEPHRINE (PF) 0.5% -1:200000 IJ SOLN
INTRAMUSCULAR | Status: AC
  Filled 2021-02-09: qty 30

## 2021-02-09 MED ORDER — HYDROCODONE-ACETAMINOPHEN 5-325 MG PO TABS: 1.0000 | ORAL_TABLET | ORAL | 0 refills | Status: DC | PRN

## 2021-02-09 MED ORDER — FENTANYL CITRATE (PF) 100 MCG/2ML IJ SOLN
INTRAMUSCULAR | Status: DC | PRN
  Administered 2021-02-09 (×2): 25 ug via INTRAVENOUS

## 2021-02-09 MED ORDER — GLYCOPYRROLATE 0.2 MG/ML IJ SOLN
INTRAMUSCULAR | Status: DC | PRN
  Administered 2021-02-09: .1 mg via INTRAVENOUS

## 2021-02-09 MED ORDER — MIDAZOLAM HCL 2 MG/2ML IJ SOLN
INTRAMUSCULAR | Status: DC | PRN
  Administered 2021-02-09: 2 mg via INTRAVENOUS

## 2021-02-09 MED ORDER — LACTATED RINGERS IV SOLN: INTRAVENOUS | Status: DC

## 2021-02-09 MED ORDER — MEPERIDINE HCL 25 MG/ML IJ SOLN: 6.2500 mg | INTRAMUSCULAR | Status: DC | PRN

## 2021-02-09 MED ORDER — LIDOCAINE HCL (PF) 2 % IJ SOLN
INTRAMUSCULAR | Status: AC
  Filled 2021-02-09: qty 5

## 2021-02-09 MED ORDER — KETAMINE HCL 50 MG/5ML IJ SOSY
PREFILLED_SYRINGE | INTRAMUSCULAR | Status: AC
  Filled 2021-02-09: qty 5

## 2021-02-09 MED ORDER — FENTANYL CITRATE (PF) 100 MCG/2ML IJ SOLN
INTRAMUSCULAR | Status: AC
  Filled 2021-02-09: qty 2

## 2021-02-09 MED ORDER — DEXAMETHASONE SODIUM PHOSPHATE 10 MG/ML IJ SOLN
INTRAMUSCULAR | Status: DC | PRN
Start: 2021-02-09 — End: 2021-02-09
  Administered 2021-02-09: 5 mg via INTRAVENOUS

## 2021-02-09 MED ORDER — KETAMINE HCL 10 MG/ML IJ SOLN
INTRAMUSCULAR | Status: DC | PRN
  Administered 2021-02-09: 20 mg via INTRAVENOUS

## 2021-02-09 MED ORDER — ACETAMINOPHEN 10 MG/ML IV SOLN
INTRAVENOUS | Status: DC | PRN
  Administered 2021-02-09: 1000 mg via INTRAVENOUS

## 2021-02-09 SURGICAL SUPPLY — 30 items
BLADE SURG 15 STRL SS SAFETY (BLADE) ×2 IMPLANT
CHLORAPREP W/TINT 26 (MISCELLANEOUS) ×2 IMPLANT
CNTNR SPEC 2.5X3XGRAD LEK (MISCELLANEOUS) ×1
CONT SPEC 4OZ STER OR WHT (MISCELLANEOUS) ×1
CONTAINER SPEC 2.5X3XGRAD LEK (MISCELLANEOUS) ×1 IMPLANT
DRAPE LAPAROTOMY 100X77 ABD (DRAPES) ×2 IMPLANT
DRSG TEGADERM 4X4.75 (GAUZE/BANDAGES/DRESSINGS) ×2 IMPLANT
DRSG TELFA 4X3 1S NADH ST (GAUZE/BANDAGES/DRESSINGS) ×2 IMPLANT
ELECT REM PT RETURN 9FT ADLT (ELECTROSURGICAL) ×2
ELECTRODE REM PT RTRN 9FT ADLT (ELECTROSURGICAL) ×1 IMPLANT
GAUZE 4X4 16PLY ~~LOC~~+RFID DBL (SPONGE) ×2 IMPLANT
GLOVE SURG ENC MOIS LTX SZ7.5 (GLOVE) ×2 IMPLANT
GLOVE SURG UNDER LTX SZ8 (GLOVE) ×2 IMPLANT
GOWN STRL REUS W/ TWL LRG LVL3 (GOWN DISPOSABLE) ×2 IMPLANT
GOWN STRL REUS W/TWL LRG LVL3 (GOWN DISPOSABLE) ×2
MANIFOLD NEPTUNE II (INSTRUMENTS) ×2 IMPLANT
NDL HYPO 25X1 1.5 SAFETY (NEEDLE) ×1 IMPLANT
NEEDLE HYPO 25X1 1.5 SAFETY (NEEDLE) ×2 IMPLANT
PACK BASIN MINOR ARMC (MISCELLANEOUS) ×2 IMPLANT
STRIP CLOSURE SKIN 1/2X4 (GAUZE/BANDAGES/DRESSINGS) ×2 IMPLANT
SUT VIC AB 2-0 CT1 (SUTURE) ×2 IMPLANT
SUT VIC AB 3-0 54X BRD REEL (SUTURE) IMPLANT
SUT VIC AB 3-0 BRD 54 (SUTURE) ×1
SUT VIC AB 3-0 SH 27 (SUTURE) ×1
SUT VIC AB 3-0 SH 27X BRD (SUTURE) ×1 IMPLANT
SUT VIC AB 4-0 FS2 27 (SUTURE) ×2 IMPLANT
SUT VICRYL+ 3-0 144IN (SUTURE) ×2 IMPLANT
SWABSTK COMLB BENZOIN TINCTURE (MISCELLANEOUS) ×2 IMPLANT
SYR CONTROL 10ML LL (SYRINGE) ×2 IMPLANT
WATER STERILE IRR 500ML POUR (IV SOLUTION) ×2 IMPLANT

## 2021-02-09 NOTE — Transfer of Care (Signed)
Immediate Anesthesia Transfer of Care Note  Patient: John Davis  Procedure(s) Performed: INGUINAL LYMPH NODE BIOPSY (Left: Groin)  Patient Location: PACU  Anesthesia Type:General  Level of Consciousness: drowsy  Airway & Oxygen Therapy: Patient Spontanous Breathing  Post-op Assessment: Report given to RN and Post -op Vital signs reviewed and stable  Post vital signs: Reviewed and stable  Last Vitals:  Vitals Value Taken Time  BP 119/60 02/09/21 1223  Temp 36.1 C 02/09/21 1223  Pulse 56 02/09/21 1226  Resp 15 02/09/21 1226  SpO2 97 % 02/09/21 1226  Vitals shown include unvalidated device data.  Last Pain:  Vitals:   02/09/21 1033  TempSrc: Oral  PainSc: 0-No pain         Complications: No notable events documented.

## 2021-02-09 NOTE — Anesthesia Procedure Notes (Signed)
Procedure Name: LMA Insertion Date/Time: 02/09/2021 11:51 AM Performed by: Lia Foyer, CRNA Pre-anesthesia Checklist: Patient identified, Emergency Drugs available, Suction available and Patient being monitored Patient Re-evaluated:Patient Re-evaluated prior to induction Oxygen Delivery Method: Circle system utilized Preoxygenation: Pre-oxygenation with 100% oxygen Induction Type: IV induction Ventilation: Mask ventilation without difficulty LMA: LMA flexible inserted LMA Size: 4.5 Tube type: Oral Number of attempts: 1 Airway Equipment and Method: Oral airway Placement Confirmation: positive ETCO2 and breath sounds checked- equal and bilateral Tube secured with: Tape Dental Injury: Teeth and Oropharynx as per pre-operative assessment

## 2021-02-09 NOTE — Anesthesia Postprocedure Evaluation (Signed)
Anesthesia Post Note  Patient: John Davis  Procedure(s) Performed: INGUINAL LYMPH NODE BIOPSY (Left: Groin)  Patient location during evaluation: PACU Anesthesia Type: General Level of consciousness: awake and alert Pain management: pain level controlled Vital Signs Assessment: post-procedure vital signs reviewed and stable Respiratory status: spontaneous breathing, nonlabored ventilation and respiratory function stable Cardiovascular status: blood pressure returned to baseline and stable Postop Assessment: no apparent nausea or vomiting Anesthetic complications: no   No notable events documented.   Last Vitals:  Vitals:   02/09/21 1254 02/09/21 1306  BP:  126/79  Pulse: (!) 54 (!) 46  Resp: 18 16  Temp:  (!) 35.9 C  SpO2: 96% 100%    Last Pain:  Vitals:   02/09/21 1306  TempSrc: Temporal  PainSc: [place]

## 2021-02-09 NOTE — Discharge Instructions (Signed)

## 2021-02-09 NOTE — Anesthesia Preprocedure Evaluation (Addendum)
Anesthesia Evaluation  Patient identified by MRN, date of birth, ID band Patient awake    Reviewed: Allergy & Precautions, NPO status , Patient's Chart, lab work & pertinent test results  History of Anesthesia Complications Negative for: history of anesthetic complications  Airway Mallampati: I  TM Distance: >3 FB Neck ROM: Full    Dental  (+) Poor Dentition   Pulmonary neg pulmonary ROS, neg sleep apnea, neg COPD,    breath sounds clear to auscultation- rhonchi (-) wheezing      Cardiovascular Exercise Tolerance: Good (-) hypertension(-) CAD and (-) Past MI  Rhythm:Regular Rate:Normal - Systolic murmurs and - Diastolic murmurs    Neuro/Psych  Headaches, PSYCHIATRIC DISORDERS Anxiety    GI/Hepatic negative GI ROS, Neg liver ROS,   Endo/Other  negative endocrine ROSneg diabetes  Renal/GU negative Renal ROS     Musculoskeletal negative musculoskeletal ROS (+)   Abdominal (+) - obese,   Peds  Hematology negative hematology ROS (+)   Anesthesia Other Findings Past Medical History: 2017: Polycythemia     Comment:  high hemoglobin No date: PTSD (post-traumatic stress disorder) 11/08/2015: Vitamin B12 deficiency   Reproductive/Obstetrics                            Anesthesia Physical  Anesthesia Plan  ASA: 2  Anesthesia Plan: General   Post-op Pain Management:    Induction: Intravenous  PONV Risk Score and Plan: 1 and Propofol infusion, Midazolam and Ondansetron  Airway Management Planned: LMA  Additional Equipment:   Intra-op Plan:   Post-operative Plan: Extubation in OR  Informed Consent: I have reviewed the patients History and Physical, chart, labs and discussed the procedure including the risks, benefits and alternatives for the proposed anesthesia with the patient or authorized representative who has indicated his/her understanding and acceptance.     Dental advisory  given  Plan Discussed with: CRNA, Anesthesiologist and Surgeon  Anesthesia Plan Comments:        Anesthesia Quick Evaluation

## 2021-02-09 NOTE — OR Nursing (Signed)
Per PACU RN, pt does not nd to be seen by Dr. Bary Castilla in postop prior to discharge.

## 2021-02-09 NOTE — H&P (Signed)
John Davis [id number] 09-Oct-1985     HPI:  35 y/o with long standing intermittent fevers without source. Recent PET CT showed an enlarged node in the proximal left thigh. For excision.   Medications Prior to Admission  Medication Sig Dispense Refill Last Dose   aspirin EC 81 MG tablet Take 81 mg by mouth every morning. Swallow whole. (Patient not taking: Reported on 01/29/2021)      cyanocobalamin (,VITAMIN B-12,) 1000 MCG/ML injection Inject 1,000 mcg into the muscle every 30 (thirty) days.   02/02/2021   ibuprofen (ADVIL) 800 MG tablet Take 800 mg by mouth 3 (three) times daily as needed for pain.   Past Month   No Known Allergies Past Medical History:  Diagnosis Date   Polycythemia 2017   high hemoglobin   PTSD (post-traumatic stress disorder)    Vitamin B12 deficiency 11/08/2015   Past Surgical History:  Procedure Laterality Date   COLONOSCOPY W/ POLYPECTOMY  [phone]   Dr. Bary Castilla   COLONOSCOPY WITH PROPOFOL N/A 12/26/2015   Procedure: COLONOSCOPY WITH PROPOFOL;  Surgeon: Robert Bellow, MD;  Location: [facility] ENDOSCOPY;  Service: Endoscopy;  Laterality: N/A;   COLONOSCOPY WITH PROPOFOL N/A 07/07/2019   Procedure: COLONOSCOPY WITH PROPOFOL;  Surgeon: Robert Bellow, MD;  Location: [facility] ENDOSCOPY;  Service: Endoscopy;  Laterality: N/A;   ESOPHAGOGASTRODUODENOSCOPY (EGD) WITH PROPOFOL N/A 07/07/2019   Procedure: ESOPHAGOGASTRODUODENOSCOPY (EGD) WITH PROPOFOL;  Surgeon: Robert Bellow, MD;  Location: [facility] ENDOSCOPY;  Service: Endoscopy;  Laterality: N/A;   mass removed from left thigh Left 2020   Social History   Socioeconomic History   Marital status: Single    Spouse name: Not on file   Number of children: Not on file   Years of education: Not on file   Highest education level: Not on file  Occupational History   Occupation: fireman  Tobacco Use   Smoking status: Never   Smokeless tobacco: Never  Vaping Use   Vaping Use: Never used  Substance and  Sexual Activity   Alcohol use: No   Drug use: No   Sexual activity: Not on file  Other Topics Concern   Not on file  Social History Narrative   Not on file   Social Determinants of Health   Financial Resource Strain: Not on file  Food Insecurity: Not on file  Transportation Needs: Not on file  Physical Activity: Not on file  Stress: Not on file  Social Connections: Not on file  Intimate Partner Violence: Not on file   Social History   Social History Narrative   Not on file     ROS: Negative.     PE: HEENT: Negative. Lungs: Clear. Cardio: RR.   Assessment/Plan:  Proceed with planned left lower extremity lymph node excision.    John Davis John Davis 02/09/2021

## 2021-02-09 NOTE — Op Note (Signed)
Preoperative diagnosis: Left proximal thigh lymphadenopathy.  Postoperative diagnosis: Same.  Operative procedure: Excision of left proximal thigh lymph node.  Operating Surgeon: Hervey Ard, MD.  Anesthesia: General by LMA, Marcaine 0.5% with 1: 200,000 units of epinephrine, 10 cc.  Estimated blood loss: 1 cc.  Clinical note: This 35 year old male usual history of a left distal thigh wound which previously underwent operative debridement and culture at [facility] 2 years ago.  No pathologic cultures noted.  Intermittent fevers.  PET scan showed a lymph node in the left proximal thigh and excisional biopsy and culture was requested by infectious disease.  Operative note: Patient underwent general anesthesia and tolerated this well.  Ultrasound was used to confirm location of the lymph node prior to skin prep.  ChloraPrep was applied to the skin.  The above-mentioned local anesthetic was used along the planned longitudinal incision.  The skin was incised sharply and the remaining dissection completed with electrocautery.  The lymph node was just lateral to the saphenous vein.  A small crossing branch did require ligation with 3-0 Vicryl tie.  The lymph node which was firm and prominent was excised in its entirety and sent fresh to bacteriology for aerobic, anaerobic, AFB, and fungus cultures.  The wound was closed in layers with 3-0 Vicryl to the superficial fascia and some volar running suture to the adipose tissue.  The skin was closed with a running 4-0 Vicryl subcuticular suture.  Benzoin and Steri-Strips followed by Telfa and Tegaderm dressing applied.  Patient tolerated the procedure well was taken to the PACU in stable condition.

## 2021-02-13 LAB — ACID FAST SMEAR (AFB, MYCOBACTERIA): Acid Fast Smear: NEGATIVE

## 2021-02-14 LAB — AEROBIC/ANAEROBIC CULTURE W GRAM STAIN (SURGICAL/DEEP WOUND)
Culture: NO GROWTH
Gram Stain: NONE SEEN

## 2021-03-03 IMAGING — RF FLUORO GUIDED NEEDLE PLACEMENT
2 series · 2 of 2 positions shown · non-contrast
Comparison: none

CLINICAL DATA: Right wrist pain.

[Series 1: cp_standard · 0.10mm/px · 1 of 1 slices shown (1 of 2)]
[im 1/1]
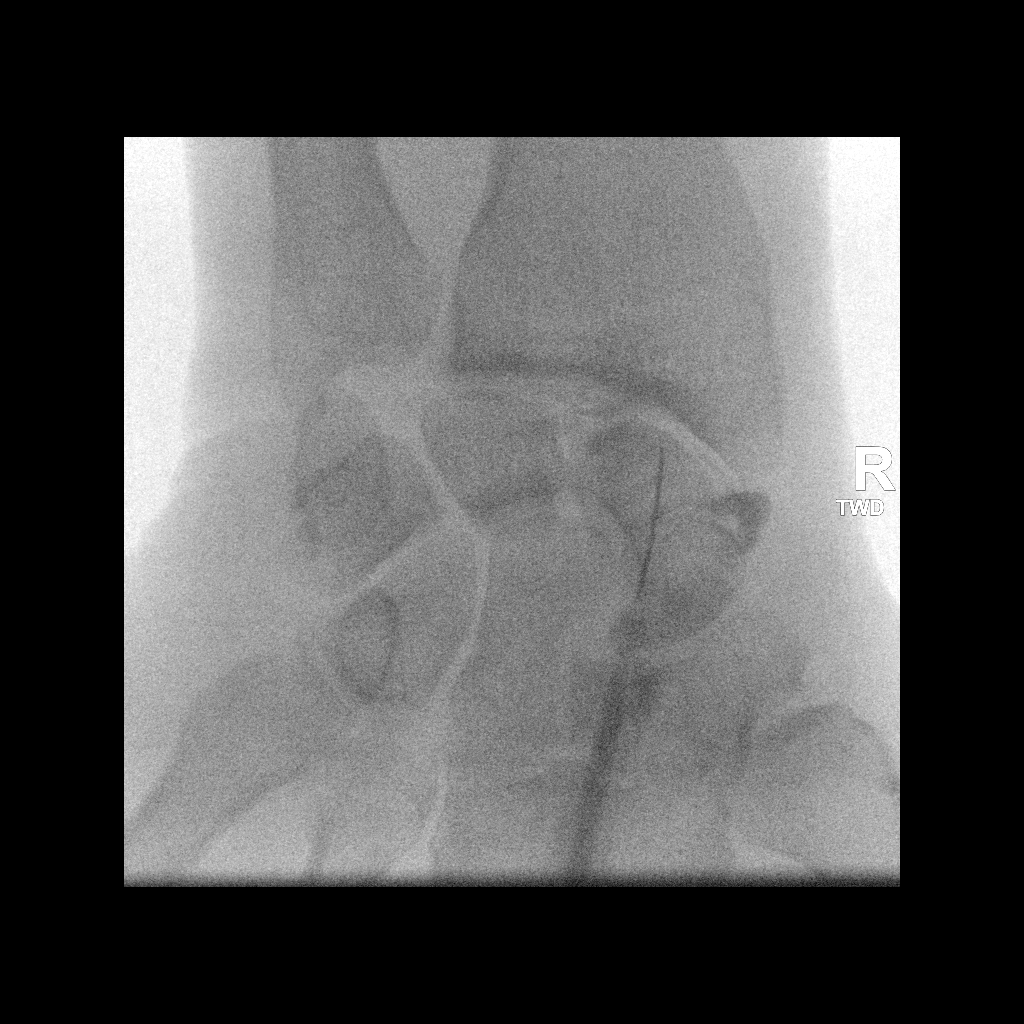

[Series 2: cp_standard · 0.10mm/px · 1 of 1 slices shown (2 of 2)]
[im 1/1]
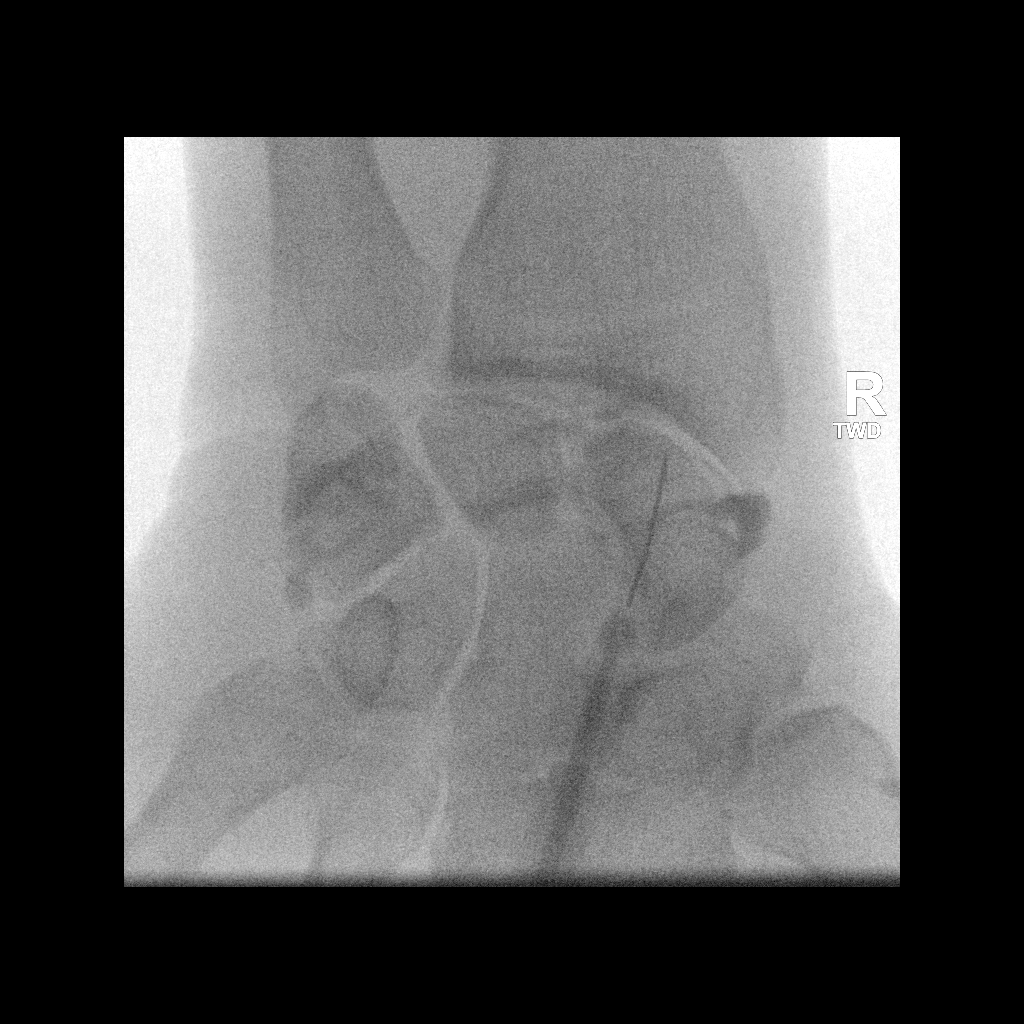

[2 of 2 positions shown; findings below may reference images not displayed]

FLUOROSCOPY TIME:  Fluoroscopy Time: 12 seconds

Radiation Exposure Index: 1.19 microGray*m^2

PROCEDURE:
RIGHT WRIST INJECTION UNDER FLUOROSCOPY

An appropriate skin entrance site was determined. The site was
marked, prepped with Betadine, draped in the usual sterile fashion,
and infiltrated locally with 1% Lidocaine. A 25 gauge skin needle
was advanced into the radiocarpal joint under intermittent
fluoroscopy. A mixture of 0.05 mL of MultiHance, 5 mL of Isovue-M
200, and 5 mL of sterile saline was then used to opacify the
proximal carpal joint. 1.5 mL of this mixture were injected. No
immediate complication.
IMPRESSION: Technically successful right wrist injection for MRI.

## 2021-03-13 LAB — FUNGAL ORGANISM REFLEX

## 2021-03-13 LAB — FUNGUS CULTURE RESULT

## 2021-03-13 LAB — FUNGUS CULTURE WITH STAIN

## 2021-03-27 ENCOUNTER — Other Ambulatory Visit: Payer: Self-pay | Admitting: Infectious Diseases

## 2021-03-27 ENCOUNTER — Other Ambulatory Visit (HOSPITAL_COMMUNITY): Payer: Self-pay | Admitting: Infectious Diseases

## 2021-03-27 DIAGNOSIS — D481 Neoplasm of uncertain behavior of connective and other soft tissue: Secondary | ICD-10-CM

## 2021-03-27 DIAGNOSIS — R509 Fever, unspecified: Secondary | ICD-10-CM

## 2021-03-27 DIAGNOSIS — R590 Localized enlarged lymph nodes: Secondary | ICD-10-CM

## 2021-03-27 DIAGNOSIS — R161 Splenomegaly, not elsewhere classified: Secondary | ICD-10-CM

## 2021-03-27 DIAGNOSIS — R948 Abnormal results of function studies of other organs and systems: Secondary | ICD-10-CM

## 2021-03-27 DIAGNOSIS — R197 Diarrhea, unspecified: Secondary | ICD-10-CM

## 2021-03-29 LAB — ACID FAST CULTURE WITH REFLEXED SENSITIVITIES (MYCOBACTERIA): Acid Fast Culture: NEGATIVE

## 2021-04-26 ENCOUNTER — Other Ambulatory Visit: Payer: Self-pay

## 2021-04-26 ENCOUNTER — Encounter (HOSPITAL_COMMUNITY): Payer: Self-pay

## 2021-04-26 ENCOUNTER — Ambulatory Visit (HOSPITAL_COMMUNITY)
Admission: RE | Admit: 2021-04-26 | Discharge: 2021-04-26 | Disposition: A | Discharge status: Home / Self Care | Payer: BC Managed Care – PPO | Source: Ambulatory Visit | Attending: Infectious Diseases | Admitting: Infectious Diseases

## 2021-04-26 ENCOUNTER — Ambulatory Visit (HOSPITAL_COMMUNITY): Admission: RE | Admit: 2021-04-26 | Payer: BC Managed Care – PPO | Source: Ambulatory Visit

## 2021-04-26 DIAGNOSIS — D481 Neoplasm of uncertain behavior of connective and other soft tissue: Secondary | ICD-10-CM | POA: Diagnosis present

## 2021-04-26 DIAGNOSIS — R948 Abnormal results of function studies of other organs and systems: Secondary | ICD-10-CM | POA: Insufficient documentation

## 2021-04-26 DIAGNOSIS — R161 Splenomegaly, not elsewhere classified: Secondary | ICD-10-CM | POA: Insufficient documentation

## 2021-04-26 DIAGNOSIS — R509 Fever, unspecified: Secondary | ICD-10-CM | POA: Insufficient documentation

## 2021-04-26 DIAGNOSIS — R197 Diarrhea, unspecified: Secondary | ICD-10-CM | POA: Insufficient documentation

## 2021-04-26 DIAGNOSIS — R590 Localized enlarged lymph nodes: Secondary | ICD-10-CM | POA: Insufficient documentation

## 2021-04-26 LAB — GLUCOSE, CAPILLARY: Glucose-Capillary: 97 mg/dL (ref 70–99)

## 2021-04-26 MED ORDER — FLUDEOXYGLUCOSE F - 18 (FDG) INJECTION
8.6000 | Freq: Once | INTRAVENOUS | Status: AC
  Administered 2021-04-26: 8.9 via INTRAVENOUS

## 2021-06-28 ENCOUNTER — Ambulatory Visit
Admission: RE | Admit: 2021-06-28 | Discharge: 2021-06-28 | Disposition: A | Discharge status: Home / Self Care | Payer: BC Managed Care – PPO | Source: Ambulatory Visit | Attending: Internal Medicine | Admitting: Internal Medicine

## 2021-07-02 ENCOUNTER — Telehealth: Payer: Self-pay | Admitting: Infectious Diseases

## 2021-07-02 NOTE — Telephone Encounter (Signed)
Hi John Davis This Pt scheduled for repeat LN biopsy.  We will need to send a sample to [facility] lab for 16s testing.   The info is below. How can I help facilitate this?  Dr Bary Castilla will be doing the biopsy again.  Should also be sent for path.  Thanks    TEST ID : [id number] Broad Range Bacterial PCR and Sequencing, Varies  [email or web address]

## 2021-07-18 ENCOUNTER — Other Ambulatory Visit: Payer: Self-pay | Admitting: General Surgery

## 2021-07-18 NOTE — Progress Notes (Signed)
Subjective:     Patient ID: John Davis is a 36 y.o. male.   HPI   The following portions of the patient's history were reviewed and updated as appropriate.   This an established patient is here today for: office visit. The patient is here today for evaluation of left thigh lymphadenopathy. Patient had an excision of a proximal thigh lymph node on 02-09-21.   Patient reports cold chills and night sweats which subsided after surgery in December 2022 but returned in February 2023. He reports they improved while he was on doxycycline for right thigh trauma vs. Bug bite. The symptoms have reappeared since he is off the antibiotics which he stopped 16 days ago. Patient saw John Ormond, PA at that time.    The patient recently had a biopsy of the left mid thigh lymph node on February 09, 2021 which fortunately was misplaced in lab and pathology is not completed.  He has had ongoing symptoms as noted above and in recent discussion with his ID physician the possibility of sending samples for PCR analysis to [facility] has been discussed.  This be done as a core biopsy or a formal excision as in the past is being reviewed.      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) 03/17/2019   Polycythemia     PTSD (post-traumatic stress disorder)     Wound drainage, <1.5 cm of left anterior thigh 04/04/2019           Past Surgical History:  Procedure Laterality Date   COLONOSCOPY   1999   COLONOSCOPY   2001   COLONOSCOPY   2011   COLONOSCOPY   12/26/2015   BIOPSY SOFT TISSUE THIGH/KNEE DEEP Left 03/05/2019    Procedure: BIOPSY, SOFT TISSUE OF  THIGH OR KNEE AREA; DEEP (SUBFASCIAL OR INTRAMUSCULAR), left thigh mass;  Surgeon: Visgauss, Dewaine Oats, MD;  Location: [place];  Service: Orthopedics;  Laterality: Left;   DEBRIDEMENT LEG Left 03/05/2019    Procedure: DEBRIDEMENT, LEG; MUSCLE AND/OR FASCIA (INCLUDES EPIDERMIS, DERMIS, AND SUBCUTANEOUS TISSUE, IF PERFORMED); FIRST 20 SQ CM OR LESS;  Surgeon: Visgauss, Dewaine Oats, MD;  Location: [facility];  Service: Orthopedics;  Laterality: Left;   COLONOSCOPY   07/07/2019    7 small tubular adenomas.  3-year follow-up.   EGD   07/07/2019    Normal   excision of proximal thigh lymph node Left 02/09/2021    Dr. Bary Castilla   TOOTH EXTRACTION        two wisdom teeth          Social History           Socioeconomic History   Marital status: Single  Tobacco Use   Smoking status: Never   Smokeless tobacco: Never  Vaping Use   Vaping Use: Never used  Substance and Sexual Activity   Alcohol use: No      Comment: allergic to alcohol   Drug use: Never   Sexual activity: Defer  Social Determinants of Health       Food Insecurity: No Food Insecurity   Worried About Charity fundraiser in the Last Year: Never true   Ran Out of Food in the Last Year: Never true        No Known Allergies   Current Medications        Current Outpatient Medications  Medication Sig Dispense Refill   acetaminophen (TYLENOL ORAL) Take by mouth as needed       ciprofloxacin HCl (CILOXAN) 0.3 % ophthalmic solution 1 drop to affected eye 4 times a day for 5 days 5 mL 0   cyanocobalamin (VITAMIN B12) 1,000 mcg/mL injection Inject 1 mL (1,000 mcg total) into the muscle monthly 10 mL 4   ibuprofen (MOTRIN) 800 MG tablet Take 1 tablet (800 mg total) by mouth every 8 (eight) hours as needed for Pain 30 tablet 2    No current facility-administered medications for this visit.             Family History  Problem Relation Age of Onset   Colon cancer Mother     High blood pressure (Hypertension)  Mother     High blood pressure (Hypertension) Father     Colon polyps Sister     High blood pressure (Hypertension) Brother     Colon cancer Maternal Grandmother     Diabetes type II Maternal Grandmother     Colon polyps Maternal Grandmother     Lynch Syndrome Maternal Grandfather          Status post subtotal colectomy.   High blood pressure (Hypertension) Maternal Grandfather     No Known Problems Paternal Grandmother     Myocardial Infarction (Heart attack) Paternal Grandfather     Anesthesia problems Neg Hx          Labs and Radiology:    June 05, 2021 laboratory:   Glucose 70 - 110 mg/dL 142 High    Sodium 136 - 145 mmol/L 139   Potassium 3.6 - 5.1 mmol/L 4.1   Chloride 97 - 109 mmol/L 103   Carbon Dioxide (CO2) 22.0 - 32.0 mmol/L 30.0   Calcium 8.7 - 10.3 mg/dL 9.7   Urea Nitrogen (BUN) 7 - 25 mg/dL 17   Creatinine 0.7 - 1.3 mg/dL 1.1   Glomerular Filtration Rate (eGFR), MDRD Estimate >60 mL/min/1.73sq m 75   BUN/Crea Ratio 6.0 - 20.0 15.5   Anion Gap w/K 6.0 - 16.0 10.1     WBC (White Blood Cell Count) 4.1 - 10.2 10^3/uL 6.9   RBC (Red Blood Cell Count) 4.69 - 6.13 10^6/uL 5.64   Hemoglobin 14.1 - 18.1 gm/dL 17.6   Hematocrit 40.0 - 52.0 % 50.8   MCV (Mean Corpuscular Volume) 80.0 - 100.0 fl 90.1   MCH (Mean Corpuscular Hemoglobin) 27.0 - 31.2 pg 31.2   MCHC (Mean Corpuscular Hemoglobin Concentration) 32.0 - 36.0 gm/dL 34.6   Platelet Count 150 - 450 10^3/uL 208   RDW-CV (Red Cell Distribution Width) 11.6 - 14.8 % 12.1   MPV (Mean Platelet Volume) 9.4 - 12.4 fl 11.3   Neutrophils 1.50 - 7.80 10^3/uL 4.65   Lymphocytes 1.00 - 3.60 10^3/uL 1.54   Monocytes 0.00 - 1.50 10^3/uL 0.51   Eosinophils 0.00 - 0.55 10^3/uL 0.15   Basophils 0.00 - 0.09 10^3/uL 0.05   Neutrophil % 32.0 - 70.0 % 67.1   Lymphocyte % 10.0 - 50.0 % 22.3   Monocyte % 4.0 - 13.0 % 7.4  Eosinophil % 1.0 - 5.0 % 2.2   Basophil% 0.0 - 2.0 % 0.7   Immature Granulocyte % <=0.7 % 0.3   Immature  Granulocyte Count <=0.06 10^3/L 0.02           Left lower extremity ultrasound 25, 2023:   Office examination of the left lateral was completed to determine appropriate options for biopsy.  Adjacent to the original biopsy site left medial thigh, anteriorly there is a complex 0.7 x 1.13 x 1.74 mass consistent with a lymph node.  Intralesional flow was noted.  In the groin normal lymph nodes measuring less than 6 mm identified.       Review of Systems Constitutional: Negative for chills and fever. Respiratory: Negative for cough.          Objective:   Physical Exam Constitutional:      Appearance: Normal appearance.  Cardiovascular:     Rate and Rhythm: Normal rate and regular rhythm.     Pulses: Normal pulses.     Heart sounds: Normal heart sounds.  Pulmonary:     Effort: Pulmonary effort is normal.     Breath sounds: Normal breath sounds.  Musculoskeletal:     Cervical back: Neck supple.  Neurological:     Mental Status: He is alert and oriented to person, place, and time.  Psychiatric:        Mood and Affect: Mood normal.        Behavior: Behavior normal.    Left lower extremity: Well-healed scar from prior biopsy.  Thickening noted on prior PET CT and today's ultrasound really minimally palpable.      Assessment:     Abnormal imaging of the left anterior thigh with no clear diagnosis.    Plan:     Pros and cons of open versus core biopsy reviewed.  I think it be better to fully excise the lesion have to go for processing at [facility] clinic and any other have to be processed locally.   Patient will coordinate with family Transportation has been procedure will be scheduled at the time for the transfer specimen to [facility] hopefully appropriate transfer within the [facility] hospital system.   The patient denies the desire to wait till July for scheduling.  In general he is doing well and I do not see a problem with this.  He was to contact Dr. Ola Spurr to see if this was  unacceptable delay for this point of view.    This note is partially prepared by Ledell Noss, CMA acting as a scribe in the presence of Dr. Hervey Ard, MD.    The documentation recorded by the scribe accurately reflects the service I personally performed and the decisions made by me.    Robert Bellow, MD FACS  The pathology department has been notified of the date of surgery so arrangements can be made for specimen transport to [facility].

## 2021-08-03 ENCOUNTER — Other Ambulatory Visit: Payer: Self-pay | Admitting: Certified Registered Nurse Anesthetist

## 2021-08-03 DIAGNOSIS — R2242 Localized swelling, mass and lump, left lower limb: Secondary | ICD-10-CM

## 2021-08-03 DIAGNOSIS — D751 Secondary polycythemia: Secondary | ICD-10-CM

## 2021-08-03 DIAGNOSIS — D481 Neoplasm of uncertain behavior of connective and other soft tissue: Secondary | ICD-10-CM

## 2021-08-03 DIAGNOSIS — D4819 Other specified neoplasm of uncertain behavior of connective and other soft tissue: Secondary | ICD-10-CM

## 2021-08-15 ENCOUNTER — Other Ambulatory Visit: Payer: BC Managed Care – PPO

## 2021-08-16 ENCOUNTER — Ambulatory Visit
Admission: RE | Admit: 2021-08-16 | Discharge: 2021-08-16 | Disposition: A | Discharge status: Home / Self Care | Payer: BC Managed Care – PPO | Source: Ambulatory Visit | Attending: Certified Registered Nurse Anesthetist | Admitting: Certified Registered Nurse Anesthetist

## 2021-08-16 ENCOUNTER — Other Ambulatory Visit: Payer: Self-pay

## 2021-08-16 DIAGNOSIS — Z0283 Encounter for blood-alcohol and blood-drug test: Secondary | ICD-10-CM

## 2021-08-16 DIAGNOSIS — D481 Neoplasm of uncertain behavior of connective and other soft tissue: Secondary | ICD-10-CM

## 2021-08-16 DIAGNOSIS — R2242 Localized swelling, mass and lump, left lower limb: Secondary | ICD-10-CM

## 2021-08-16 DIAGNOSIS — D751 Secondary polycythemia: Secondary | ICD-10-CM

## 2021-08-16 MED ORDER — GADOBENATE DIMEGLUMINE 529 MG/ML IV SOLN
15.0000 mL | Freq: Once | INTRAVENOUS | Status: AC | PRN
  Administered 2021-08-16: 15 mL via INTRAVENOUS

## 2021-08-16 NOTE — Progress Notes (Signed)
Presents to [facility] clinic for on-site random DOT drug screen.  [facility] Acct #:  [id number] [facility] Specimen #:  [id number]  AMD

## 2021-08-17 ENCOUNTER — Encounter
Admission: RE | Admit: 2021-08-17 | Discharge: 2021-08-17 | Disposition: A | Discharge status: Home / Self Care | Payer: BC Managed Care – PPO | Source: Ambulatory Visit | Attending: General Surgery | Admitting: General Surgery

## 2021-08-17 ENCOUNTER — Other Ambulatory Visit: Payer: Self-pay

## 2021-08-17 NOTE — Patient Instructions (Addendum)
Your procedure is scheduled on: 08/24/21 - Friday Report to the Registration Desk on the 1st floor of the [facility]. To find out your arrival time, please call [phone] between 1PM - 3PM on: 08/23/21 - Thursday If your arrival time is 6:00 am, do not arrive prior to that time as the [facility] entrance doors do not open until 6:00 am.  REMEMBER: Instructions that are not followed completely may result in serious medical risk, up to and including death; or upon the discretion of your surgeon and anesthesiologist your surgery may need to be rescheduled.  Do not eat food after midnight the night before surgery.  No gum chewing, lozengers or hard candies.  You may however, drink CLEAR liquids up to 2 hours before you are scheduled to arrive for your surgery. Do not drink anything within 2 hours of your scheduled arrival time.  Clear liquids include: - water  - apple juice without pulp - gatorade (not RED colors) - black coffee or tea (Do NOT add milk or creamers to the coffee or tea) Do NOT drink anything that is not on this list.  TAKE THESE MEDICATIONS THE MORNING OF SURGERY WITH A SIP OF WATER: NONE  One week prior to surgery: Stop Anti-inflammatories (NSAIDS) such as Advil, Aleve, Ibuprofen, Motrin, Naproxen, Naprosyn and Aspirin based products such as Excedrin, Goodys Powder, BC Powder.  Stop ANY OVER THE COUNTER supplements until after surgery.  You may take Tylenol if needed for pain up until the day of surgery.  No Alcohol for 24 hours before or after surgery.  No Smoking including e-cigarettes for 24 hours prior to surgery.  No chewable tobacco products for at least 6 hours prior to surgery.  No nicotine patches on the day of surgery.  Do not use any "recreational" drugs for at least a week prior to your surgery.  Please be advised that the combination of cocaine and anesthesia may have negative outcomes, up to and including death. If you test positive for  cocaine, your surgery will be cancelled.  On the morning of surgery brush your teeth with toothpaste and water, you may rinse your mouth with mouthwash if you wish. Do not swallow any toothpaste or mouthwash.  Use CHG Soap or wipes as directed on instruction sheet.  Do not wear jewelry, make-up, hairpins, clips or nail polish.  Do not wear lotions, powders, or perfumes.   Do not shave body from the neck down 48 hours prior to surgery just in case you cut yourself which could leave a site for infection.  Also, freshly shaved skin may become irritated if using the CHG soap.  Contact lenses, hearing aids and dentures may not be worn into surgery.  Do not bring valuables to the hospital. [facility] is not responsible for any missing/lost belongings or valuables.   Notify your doctor if there is any change in your medical condition (cold, fever, infection).  Wear comfortable clothing (specific to your surgery type) to the hospital.  After surgery, you can help prevent lung complications by doing breathing exercises.  Take deep breaths and cough every 1-2 hours. Your doctor may order a device called an Incentive Spirometer to help you take deep breaths. When coughing or sneezing, hold a pillow firmly against your incision with both hands. This is called "splinting." Doing this helps protect your incision. It also decreases belly discomfort.  If you are being admitted to the hospital overnight, leave your suitcase in the car. After surgery it  may be brought to your room.  If you are being discharged the day of surgery, you will not be allowed to drive home. You will need a responsible adult (18 years or older) to drive you home and stay with you that night.   If you are taking public transportation, you will need to have a responsible adult (18 years or older) with you. Please confirm with your physician that it is acceptable to use public transportation.   Please call the [facility] Dept. at [phone] if you have any questions about these instructions.  Surgery Visitation Policy:  Patients undergoing a surgery or procedure may have two family members or support persons with them as long as the person is not COVID-19 positive or experiencing its symptoms.   Inpatient Visitation:    Visiting hours are 7 a.m. to 8 p.m. Up to four visitors are allowed at one time in a patient room, including children. The visitors may rotate out with other people during the day. One designated support person (adult) may remain overnight.

## 2021-08-21 IMAGING — US US EXTREM LOW VENOUS*L*
1 series · 13 of 24 positions shown · non-contrast
Comparison: None.

CLINICAL DATA: Left lower extremity pain.



[Series 1: us extrem low venous*left* · 0.06mm/px · 13 of 40 slices shown]
[im 1/40]
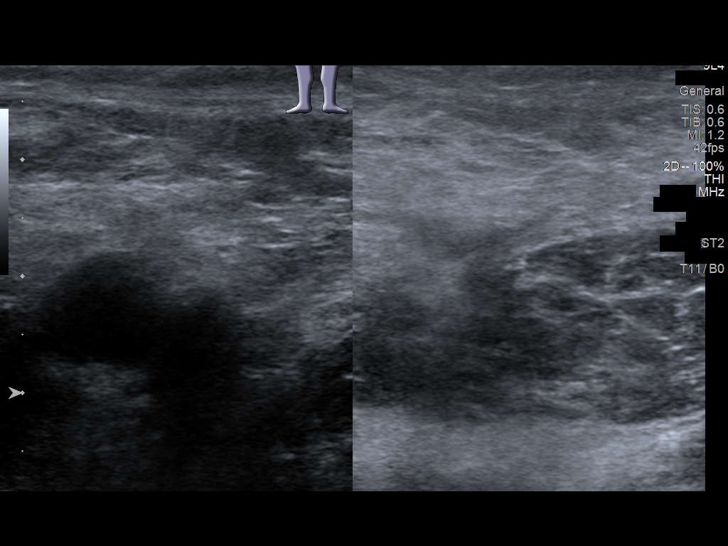
[im 4/40]
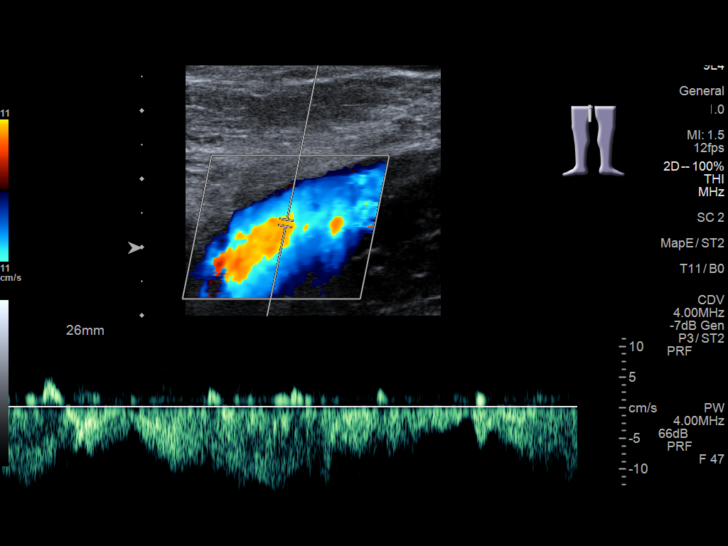
[im 7/40]
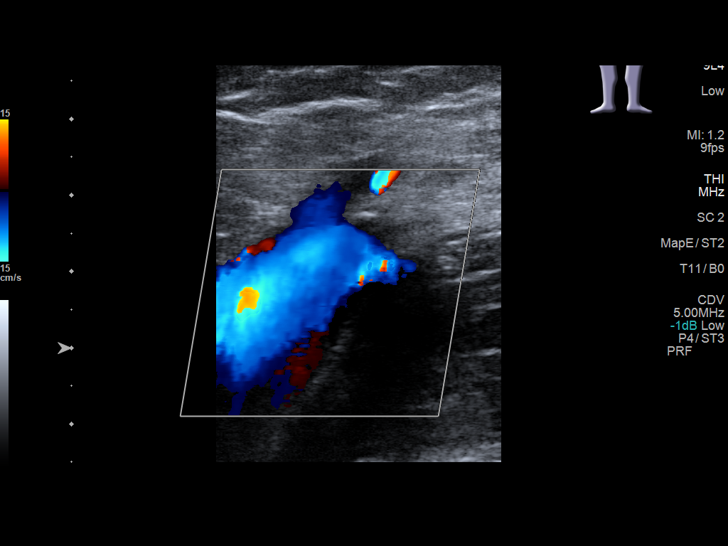
[im 11/40]
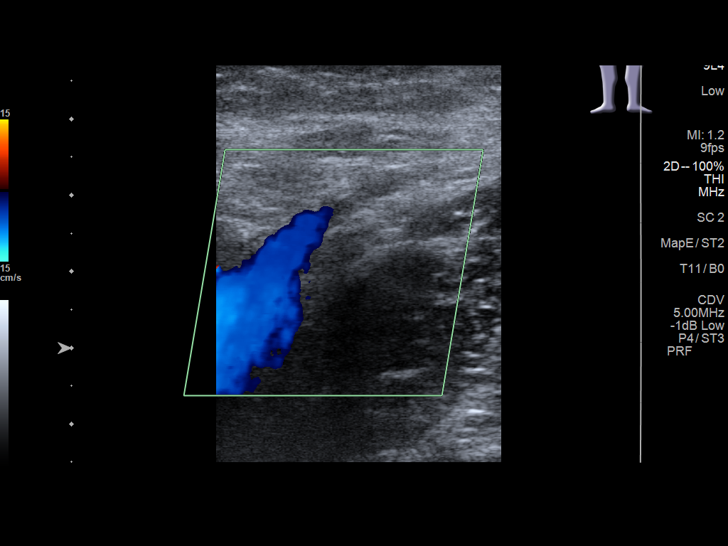
[im 14/40]
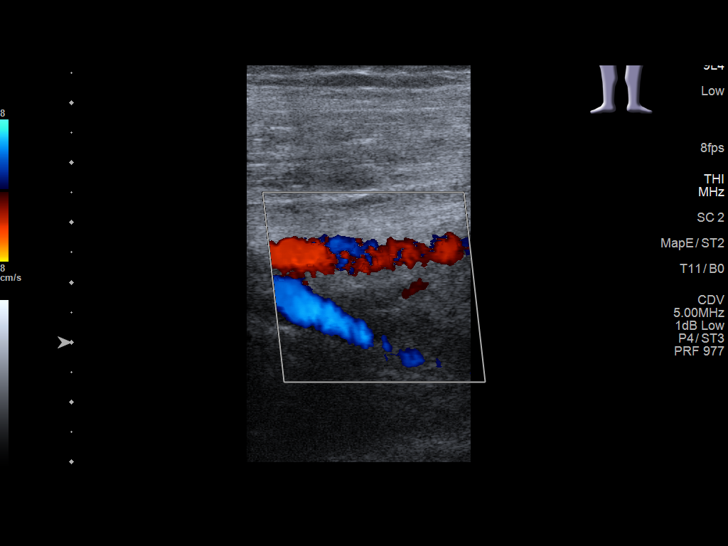
[im 17/40]
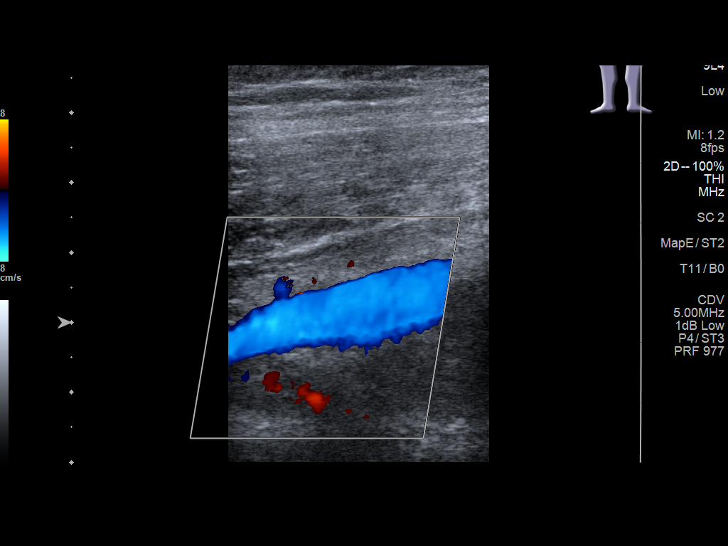
[im 21/40]
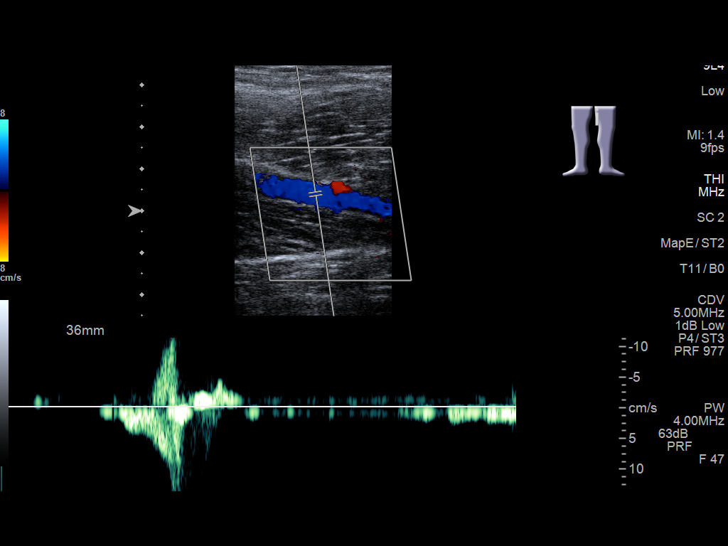
[im 23/40]
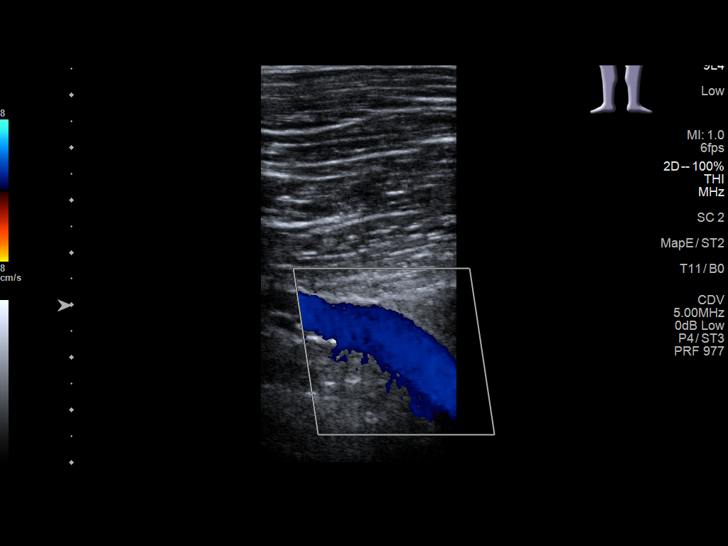
[im 26/40]
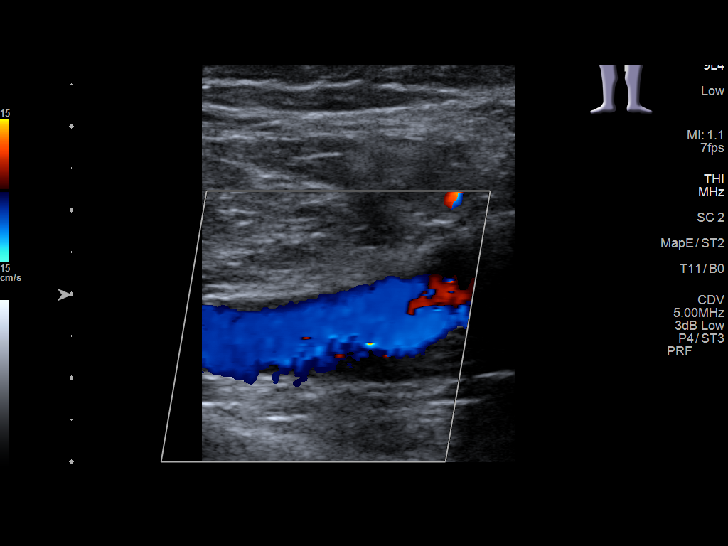
[im 29/40]
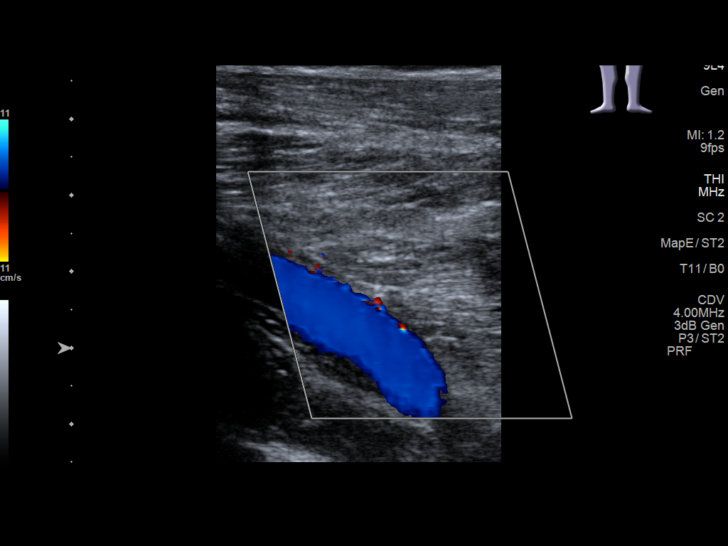
[im 33/40]
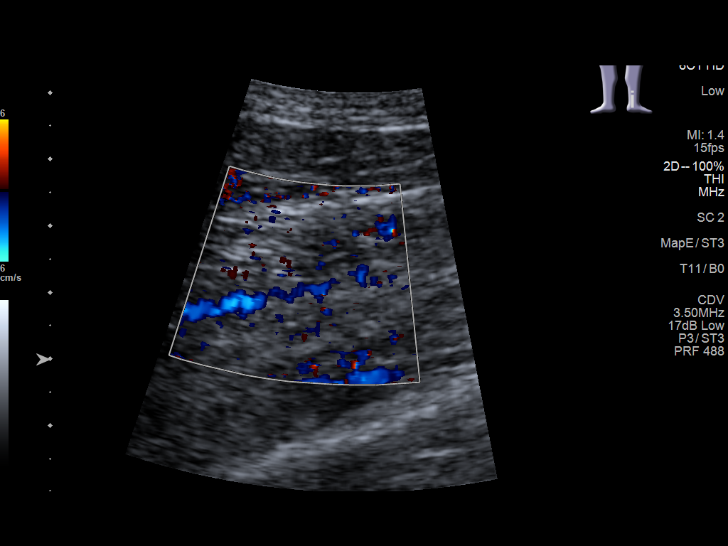
[im 36/40]
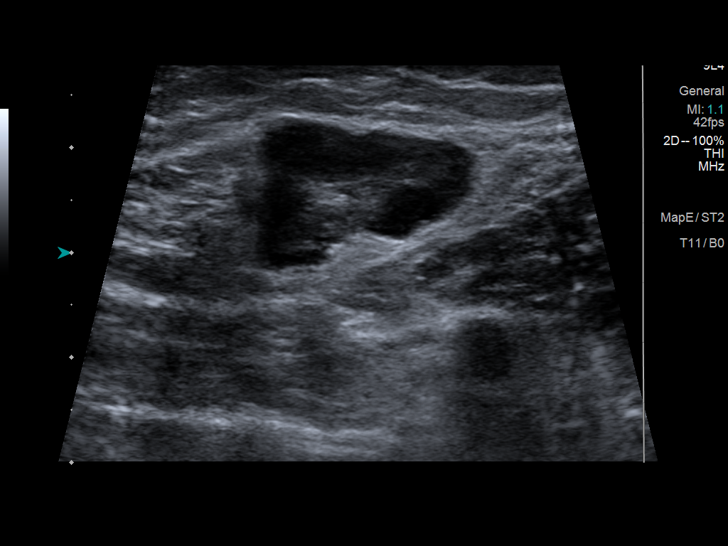
[im 40/40]
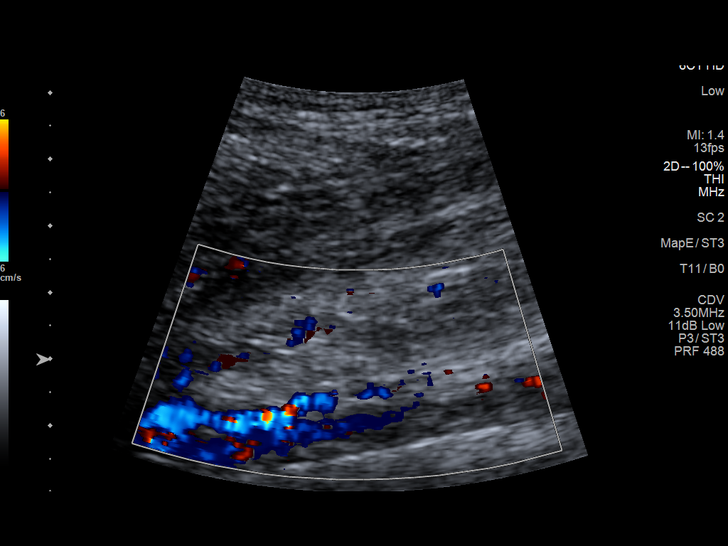

[13 of 24 positions shown; findings below may reference images not displayed]

FINDINGS: Contralateral Common Femoral Vein: Respiratory phasicity is normal
and symmetric with the symptomatic side. No evidence of thrombus.
Normal compressibility.

Common Femoral Vein: No evidence of thrombus. Normal
compressibility, respiratory phasicity and response to augmentation.

Saphenofemoral Junction: No evidence of thrombus. Normal
compressibility and flow on color Doppler imaging.

Profunda Femoral Vein: No evidence of thrombus. Normal
compressibility and flow on color Doppler imaging.

Femoral Vein: No evidence of thrombus. Normal compressibility,
respiratory phasicity and response to augmentation.

Popliteal Vein: No evidence of thrombus. Normal compressibility,
respiratory phasicity and response to augmentation.

Calf Veins: No evidence of thrombus. Normal compressibility and flow
on color Doppler imaging.

Superficial Great Saphenous Vein: No evidence of thrombus. Normal
compressibility.

Venous Reflux:  None.

Other Findings: No evidence of superficial thrombophlebitis. Single
mildly prominent left inguinal lymph node measures 1.2 cm in short
axis. This lymph node demonstrates normal lymph node architecture
with a fatty hilum and is most likely benign/reactive.
IMPRESSION: No evidence of left lower extremity deep venous thrombosis.

## 2021-08-24 ENCOUNTER — Encounter: Payer: Self-pay | Admitting: General Surgery

## 2021-08-24 ENCOUNTER — Ambulatory Visit
Admission: RE | Admit: 2021-08-24 | Discharge: 2021-08-24 | Disposition: A | Discharge status: Home / Self Care | Payer: BC Managed Care – PPO | Attending: General Surgery | Admitting: General Surgery

## 2021-08-24 ENCOUNTER — Ambulatory Visit: Payer: BC Managed Care – PPO | Admitting: Anesthesiology

## 2021-08-24 ENCOUNTER — Other Ambulatory Visit: Payer: Self-pay

## 2021-08-24 ENCOUNTER — Encounter
Admission: RE | Disposition: A | Discharge status: Home / Self Care | Payer: Self-pay | Source: Home / Self Care | Attending: General Surgery

## 2021-08-24 DIAGNOSIS — E538 Deficiency of other specified B group vitamins: Secondary | ICD-10-CM | POA: Diagnosis not present

## 2021-08-24 DIAGNOSIS — R590 Localized enlarged lymph nodes: Secondary | ICD-10-CM | POA: Diagnosis present

## 2021-08-24 DIAGNOSIS — I889 Nonspecific lymphadenitis, unspecified: Secondary | ICD-10-CM | POA: Diagnosis not present

## 2021-08-24 HISTORY — PX: LYMPH NODE BIOPSY: SHX201

## 2021-08-24 SURGERY — LYMPH NODE BIOPSY
Anesthesia: General | Laterality: Left

## 2021-08-24 MED ORDER — PROPOFOL 10 MG/ML IV BOLUS
INTRAVENOUS | Status: DC | PRN
  Administered 2021-08-24: 200 mg via INTRAVENOUS

## 2021-08-24 MED ORDER — BUPIVACAINE-EPINEPHRINE (PF) 0.5% -1:200000 IJ SOLN
INTRAMUSCULAR | Status: AC
  Filled 2021-08-24: qty 30

## 2021-08-24 MED ORDER — ACETAMINOPHEN 10 MG/ML IV SOLN: 1000.0000 mg | Freq: Once | INTRAVENOUS | Status: DC | PRN

## 2021-08-24 MED ORDER — FENTANYL CITRATE (PF) 100 MCG/2ML IJ SOLN: 25.0000 ug | INTRAMUSCULAR | Status: DC | PRN

## 2021-08-24 MED ORDER — DROPERIDOL 2.5 MG/ML IJ SOLN: 0.6250 mg | Freq: Once | INTRAMUSCULAR | Status: DC | PRN

## 2021-08-24 MED ORDER — OXYCODONE HCL 5 MG PO TABS: 5.0000 mg | ORAL_TABLET | Freq: Once | ORAL | Status: DC | PRN

## 2021-08-24 MED ORDER — GLYCOPYRROLATE 0.2 MG/ML IJ SOLN
INTRAMUSCULAR | Status: DC | PRN
  Administered 2021-08-24 (×2): .2 mg via INTRAVENOUS

## 2021-08-24 MED ORDER — DEXAMETHASONE SODIUM PHOSPHATE 10 MG/ML IJ SOLN
INTRAMUSCULAR | Status: DC | PRN
  Administered 2021-08-24: 10 mg via INTRAVENOUS

## 2021-08-24 MED ORDER — OXYCODONE HCL 5 MG/5ML PO SOLN: 5.0000 mg | Freq: Once | ORAL | Status: DC | PRN

## 2021-08-24 MED ORDER — MIDAZOLAM HCL 2 MG/2ML IJ SOLN
INTRAMUSCULAR | Status: AC
  Filled 2021-08-24: qty 2

## 2021-08-24 MED ORDER — HYDROCODONE-ACETAMINOPHEN 5-325 MG PO TABS: 1.0000 | ORAL_TABLET | ORAL | 0 refills | Status: DC | PRN

## 2021-08-24 MED ORDER — 0.9 % SODIUM CHLORIDE (POUR BTL) OPTIME
TOPICAL | Status: DC | PRN
  Administered 2021-08-24: 500 mL

## 2021-08-24 MED ORDER — CHLORHEXIDINE GLUCONATE 0.12 % MT SOLN
OROMUCOSAL | Status: AC
  Administered 2021-08-24: 15 mL via OROMUCOSAL
  Filled 2021-08-24: qty 15

## 2021-08-24 MED ORDER — ACETAMINOPHEN 10 MG/ML IV SOLN
INTRAVENOUS | Status: DC | PRN
  Administered 2021-08-24: 1000 mg via INTRAVENOUS

## 2021-08-24 MED ORDER — ORAL CARE MOUTH RINSE
15.0000 mL | Freq: Once | OROMUCOSAL | Status: AC
Start: 2021-08-24 — End: 2021-08-24

## 2021-08-24 MED ORDER — ONDANSETRON HCL 4 MG/2ML IJ SOLN
INTRAMUSCULAR | Status: DC | PRN
  Administered 2021-08-24: 4 mg via INTRAVENOUS

## 2021-08-24 MED ORDER — FAMOTIDINE 20 MG PO TABS
ORAL_TABLET | ORAL | Status: AC
  Administered 2021-08-24: 20 mg via ORAL
  Filled 2021-08-24: qty 1

## 2021-08-24 MED ORDER — CHLORHEXIDINE GLUCONATE CLOTH 2 % EX PADS
6.0000 | MEDICATED_PAD | Freq: Once | CUTANEOUS | Status: AC
  Administered 2021-08-24: 6 via TOPICAL

## 2021-08-24 MED ORDER — LACTATED RINGERS IV SOLN: INTRAVENOUS | Status: DC

## 2021-08-24 MED ORDER — BUPIVACAINE-EPINEPHRINE (PF) 0.5% -1:200000 IJ SOLN
INTRAMUSCULAR | Status: DC | PRN
  Administered 2021-08-24: 20 mL

## 2021-08-24 MED ORDER — MIDAZOLAM HCL 2 MG/2ML IJ SOLN
INTRAMUSCULAR | Status: DC | PRN
  Administered 2021-08-24: 2 mg via INTRAVENOUS

## 2021-08-24 MED ORDER — PROMETHAZINE HCL 25 MG/ML IJ SOLN: 6.2500 mg | INTRAMUSCULAR | Status: DC | PRN

## 2021-08-24 MED ORDER — PROPOFOL 1000 MG/100ML IV EMUL
INTRAVENOUS | Status: AC
  Filled 2021-08-24: qty 100

## 2021-08-24 MED ORDER — CHLORHEXIDINE GLUCONATE CLOTH 2 % EX PADS: 6.0000 | MEDICATED_PAD | Freq: Once | CUTANEOUS | Status: DC

## 2021-08-24 MED ORDER — FENTANYL CITRATE (PF) 100 MCG/2ML IJ SOLN
INTRAMUSCULAR | Status: DC | PRN
  Administered 2021-08-24: 50 ug via INTRAVENOUS

## 2021-08-24 MED ORDER — CHLORHEXIDINE GLUCONATE 0.12 % MT SOLN: 15.0000 mL | Freq: Once | OROMUCOSAL | Status: AC

## 2021-08-24 MED ORDER — FAMOTIDINE 20 MG PO TABS: 20.0000 mg | ORAL_TABLET | Freq: Once | ORAL | Status: AC

## 2021-08-24 MED ORDER — FENTANYL CITRATE (PF) 100 MCG/2ML IJ SOLN
INTRAMUSCULAR | Status: AC
  Filled 2021-08-24: qty 2

## 2021-08-24 MED ORDER — LIDOCAINE HCL (CARDIAC) PF 100 MG/5ML IV SOSY
PREFILLED_SYRINGE | INTRAVENOUS | Status: DC | PRN
  Administered 2021-08-24: 100 mg via INTRAVENOUS

## 2021-08-24 SURGICAL SUPPLY — 28 items
BLADE SURG 15 STRL SS SAFETY (BLADE) ×2 IMPLANT
CHLORAPREP W/TINT 26 (MISCELLANEOUS) ×2 IMPLANT
CNTNR SPEC 2.5X3XGRAD LEK (MISCELLANEOUS) ×1
CONT SPEC 4OZ STER OR WHT (MISCELLANEOUS) ×2
CONTAINER SPEC 2.5X3XGRAD LEK (MISCELLANEOUS) ×1 IMPLANT
DRAPE LAPAROTOMY 100X77 ABD (DRAPES) ×2 IMPLANT
DRSG TEGADERM 4X4.75 (GAUZE/BANDAGES/DRESSINGS) ×2 IMPLANT
DRSG TELFA 4X3 1S NADH ST (GAUZE/BANDAGES/DRESSINGS) ×2 IMPLANT
ELECT REM PT RETURN 9FT ADLT (ELECTROSURGICAL) ×2
ELECTRODE REM PT RTRN 9FT ADLT (ELECTROSURGICAL) ×1 IMPLANT
GLOVE BIO SURGEON STRL SZ7.5 (GLOVE) ×2 IMPLANT
GLOVE SURG UNDER LTX SZ8 (GLOVE) ×2 IMPLANT
GOWN STRL REUS W/ TWL LRG LVL3 (GOWN DISPOSABLE) ×2 IMPLANT
GOWN STRL REUS W/TWL LRG LVL3 (GOWN DISPOSABLE) ×4
MANIFOLD NEPTUNE II (INSTRUMENTS) ×2 IMPLANT
NDL HYPO 25X1 1.5 SAFETY (NEEDLE) ×1 IMPLANT
NEEDLE HYPO 25X1 1.5 SAFETY (NEEDLE) ×2 IMPLANT
NS IRRIG 500ML POUR BTL (IV SOLUTION) ×1 IMPLANT
PACK BASIN MINOR ARMC (MISCELLANEOUS) ×2 IMPLANT
STRIP CLOSURE SKIN 1/2X4 (GAUZE/BANDAGES/DRESSINGS) ×2 IMPLANT
SUT VIC AB 2-0 CT1 (SUTURE) ×2 IMPLANT
SUT VIC AB 3-0 54X BRD REEL (SUTURE) ×1 IMPLANT
SUT VIC AB 3-0 BRD 54 (SUTURE) ×2
SUT VIC AB 3-0 SH 27 (SUTURE) ×2
SUT VIC AB 3-0 SH 27X BRD (SUTURE) ×1 IMPLANT
SUT VIC AB 4-0 FS2 27 (SUTURE) ×2 IMPLANT
SWABSTK COMLB BENZOIN TINCTURE (MISCELLANEOUS) ×2 IMPLANT
SYR CONTROL 10ML LL (SYRINGE) ×2 IMPLANT

## 2021-08-24 NOTE — Anesthesia Postprocedure Evaluation (Signed)
Anesthesia Post Note  Patient: John Davis  Procedure(s) Performed: LYMPH NODE BIOPSY (Left)  Patient location during evaluation: PACU Anesthesia Type: General Level of consciousness: awake and alert Pain management: pain level controlled Vital Signs Assessment: post-procedure vital signs reviewed and stable Respiratory status: spontaneous breathing, nonlabored ventilation and respiratory function stable Cardiovascular status: blood pressure returned to baseline and stable Postop Assessment: no apparent nausea or vomiting Anesthetic complications: no   No notable events documented.   Last Vitals:  Vitals:   08/24/21 0845 08/24/21 0903  BP: 117/72 122/77  Pulse: 72 (!) 56  Resp: 17 18  Temp: (!) 36.1 C (!) 35.8 C  SpO2: 96% 100%    Last Pain:  Vitals:   08/24/21 0903  TempSrc: Temporal  PainSc: 0-No pain                 Iran Ouch

## 2021-08-24 NOTE — H&P (Signed)
John Davis [id number] Aug 04, 1985     HPI:  36 y/o male with unexplained fever/ chills. Prior left thigh biopsy processed only for pathogens, re-biopsy for pathologic evaluation.   Medications Prior to Admission  Medication Sig Dispense Refill Last Dose   aspirin EC 81 MG tablet Take 81 mg by mouth every morning. Swallow whole. (Patient not taking: Reported on 01/29/2021)   Not Taking   cyanocobalamin (,VITAMIN B-12,) 1000 MCG/ML injection Inject 1,000 mcg into the muscle every 30 (thirty) days. (Patient not taking: Reported on 08/17/2021)   Not Taking   HYDROcodone-acetaminophen (NORCO/VICODIN) 5-325 MG tablet Take 1 tablet by mouth every 4 (four) hours as needed for moderate pain. (Patient not taking: Reported on 08/17/2021) 10 tablet 0 Not Taking   ibuprofen (ADVIL) 800 MG tablet Take 800 mg by mouth 3 (three) times daily as needed for pain.   08/17/2021   No Known Allergies Past Medical History:  Diagnosis Date   Polycythemia 2017   high hemoglobin   PTSD (post-traumatic stress disorder)    Vitamin B12 deficiency 11/08/2015   Past Surgical History:  Procedure Laterality Date   COLONOSCOPY W/ POLYPECTOMY  [phone]   Dr. Bary Castilla   COLONOSCOPY WITH PROPOFOL N/A 12/26/2015   Procedure: COLONOSCOPY WITH PROPOFOL;  Surgeon: Robert Bellow, MD;  Location: [facility] ENDOSCOPY;  Service: Endoscopy;  Laterality: N/A;   COLONOSCOPY WITH PROPOFOL N/A 07/07/2019   Procedure: COLONOSCOPY WITH PROPOFOL;  Surgeon: Robert Bellow, MD;  Location: [facility] ENDOSCOPY;  Service: Endoscopy;  Laterality: N/A;   ESOPHAGOGASTRODUODENOSCOPY (EGD) WITH PROPOFOL N/A 07/07/2019   Procedure: ESOPHAGOGASTRODUODENOSCOPY (EGD) WITH PROPOFOL;  Surgeon: Robert Bellow, MD;  Location: [facility] ENDOSCOPY;  Service: Endoscopy;  Laterality: N/A;   INGUINAL LYMPH NODE BIOPSY Left 02/09/2021   Procedure: INGUINAL LYMPH NODE BIOPSY;  Surgeon: Robert Bellow, MD;  Location: [facility] ORS;  Service: General;  Laterality:  Left;   mass removed from left thigh Left 2020   WISDOM TOOTH EXTRACTION     Social History   Socioeconomic History   Marital status: Single    Spouse name: Not on file   Number of children: 1   Years of education: Not on file   Highest education level: Not on file  Occupational History   Occupation: fireman  Tobacco Use   Smoking status: Never   Smokeless tobacco: Never  Vaping Use   Vaping Use: Never used  Substance and Sexual Activity   Alcohol use: No   Drug use: No   Sexual activity: Not on file  Other Topics Concern   Not on file  Social History Narrative   Not on file   Social Determinants of Health   Financial Resource Strain: Not on file  Food Insecurity: Not on file  Transportation Needs: Not on file  Physical Activity: Not on file  Stress: Not on file  Social Connections: Not on file  Intimate Partner Violence: Not on file   Social History   Social History Narrative   Not on file     ROS: Negative.     PE: HEENT: Negative. Lungs: Clear. Cardio: RR.    Assessment/Plan:  Proceed with planned excision left proximal medial thigh mass.  Forest Gleason [facility] 08/24/2021

## 2021-08-24 NOTE — Transfer of Care (Signed)
Immediate Anesthesia Transfer of Care Note  Patient: John Davis  Procedure(s) Performed: LYMPH NODE BIOPSY (Left)  Patient Location: PACU  Anesthesia Type:General  Level of Consciousness: drowsy and patient cooperative  Airway & Oxygen Therapy: Patient Spontanous Breathing and Patient connected to face mask oxygen  Post-op Assessment: Report given to RN and Post -op Vital signs reviewed and stable  Post vital signs: Reviewed and stable  Last Vitals:  Vitals Value Taken Time  BP 109/57 08/24/21 0815  Temp    Pulse 73 08/24/21 0817  Resp 11 08/24/21 0817  SpO2 99 % 08/24/21 0817  Vitals shown include unvalidated device data.  Last Pain:  Vitals:   08/24/21 0612  TempSrc: Temporal  PainSc: 0-No pain         Complications: No notable events documented.

## 2021-08-24 NOTE — Op Note (Signed)
Preoperative diagnosis longstanding history of chills, intermittent fevers, lymphadenopathy left proximal medial thigh.  Postoperative diagnosis: Same.  Operative procedure: Ultrasound-guided excisional biopsy of left proximal medial thigh lymph node.  Operating surgeon: Hervey Ard, MD.  Anesthesia: General by LMA, Marcaine 0.5% with 1: 200,000 units of epinephrine.  Estimated blood loss: 5 cc.  Clinical note: This 36 year old male has had a long history of intermittent fevers and a prior biopsy for culture only which failed to show pathogen.  He showed persistent lymphadenopathy in the area and is returned to the operating with this time for excisional biopsy.  Operative note: Hair was removed from the surgical site with clippers prior to the procedure.  The area was infiltrated with local anesthesia.  Ultrasound was used to identify the lymph node slightly medial and inferior to the original excision site.  The skin was incised longitudinally and the adipose tissue elevated off the lymph node.  This was approximately 1-1/2 x 2-1/2 cm in size.  Hemostasis was electrocautery and 3-0 Vicryl ties.  The lymph node was above the level of the deep fascia.  The adipose layer was approximated with 2-0 Vicryl figure-of-eight sutures.  The superficial fat was approximated with a similar 3-0 Vicryl suture and the skin closed with a running 4-0 Vicryl subcuticular suture.  Benzoin and Steri-Strips followed by Telfa and Tegaderm dressing applied.  Patient tolerated procedure well and was taken to the PACU in stable condition.

## 2021-08-24 NOTE — Anesthesia Preprocedure Evaluation (Addendum)
Anesthesia Evaluation  Patient identified by MRN, date of birth, ID band Patient awake    Reviewed: Allergy & Precautions, NPO status , Patient's Chart, lab work & pertinent test results  Airway Mallampati: I  TM Distance: >3 FB Neck ROM: full    Dental no notable dental hx.    Pulmonary neg pulmonary ROS,    Pulmonary exam normal        Cardiovascular Normal cardiovascular exam  left leg lymphadenopathy   Neuro/Psych PSYCHIATRIC DISORDERS negative neurological ROS     GI/Hepatic negative GI ROS, Neg liver ROS,   Endo/Other  negative endocrine ROS  Renal/GU      Musculoskeletal   Abdominal Normal abdominal exam  (+)   Peds  Hematology negative hematology ROS (+)   Anesthesia Other Findings Past Medical History: 2017: Polycythemia     Comment:  high hemoglobin No date: PTSD (post-traumatic stress disorder) 11/08/2015: Vitamin B12 deficiency  Past Surgical History: [phone]: COLONOSCOPY W/ POLYPECTOMY     Comment:  Dr. Bary Castilla 12/26/2015: COLONOSCOPY WITH PROPOFOL; N/A     Comment:  Procedure: COLONOSCOPY WITH PROPOFOL;  Surgeon: Robert Bellow, MD;  Location: [facility] ENDOSCOPY;  Service:               Endoscopy;  Laterality: N/A; 07/07/2019: COLONOSCOPY WITH PROPOFOL; N/A     Comment:  Procedure: COLONOSCOPY WITH PROPOFOL;  Surgeon: Robert Bellow, MD;  Location: [facility] ENDOSCOPY;  Service:               Endoscopy;  Laterality: N/A; 07/07/2019: ESOPHAGOGASTRODUODENOSCOPY (EGD) WITH PROPOFOL; N/A     Comment:  Procedure: ESOPHAGOGASTRODUODENOSCOPY (EGD) WITH               PROPOFOL;  Surgeon: Robert Bellow, MD;  Location:               [facility] ENDOSCOPY;  Service: Endoscopy;  Laterality: N/A; 02/09/2021: INGUINAL LYMPH NODE BIOPSY; Left     Comment:  Procedure: INGUINAL LYMPH NODE BIOPSY;  Surgeon:               Robert Bellow, MD;  Location: [facility] ORS;  Service:                General;  Laterality: Left; 2020: mass removed from left thigh; Left No date: WISDOM TOOTH EXTRACTION  BMI    Body Mass Index: 25.10 kg/m      Reproductive/Obstetrics negative OB ROS                            Anesthesia Physical Anesthesia Plan  ASA: 1  Anesthesia Plan: General   Post-op Pain Management:    Induction: Intravenous  PONV Risk Score and Plan: 2 and Ondansetron, Dexamethasone and Midazolam  Airway Management Planned: LMA  Additional Equipment:   Intra-op Plan:   Post-operative Plan:   Informed Consent: I have reviewed the patients History and Physical, chart, labs and discussed the procedure including the risks, benefits and alternatives for the proposed anesthesia with the patient or authorized representative who has indicated his/her understanding and acceptance.     Dental Advisory Given  Plan Discussed with: Anesthesiologist, CRNA and Surgeon  Anesthesia Plan Comments:       Anesthesia Quick Evaluation

## 2021-08-24 NOTE — Anesthesia Procedure Notes (Signed)
Procedure Name: LMA Insertion Date/Time: 08/24/2021 7:46 AM  Performed by: Kelton Pillar, CRNAPre-anesthesia Checklist: Patient identified, Emergency Drugs available, Suction available and Patient being monitored Patient Re-evaluated:Patient Re-evaluated prior to induction Oxygen Delivery Method: Circle system utilized Preoxygenation: Pre-oxygenation with 100% oxygen Induction Type: IV induction Ventilation: Mask ventilation without difficulty LMA: LMA flexible inserted LMA Size: 4.5 Number of attempts: 1 Placement Confirmation: positive ETCO2, CO2 detector and breath sounds checked- equal and bilateral Dental Injury: Teeth and Oropharynx as per pre-operative assessment

## 2021-08-24 NOTE — Discharge Instructions (Signed)

## 2021-09-03 ENCOUNTER — Encounter: Payer: Self-pay | Admitting: General Surgery

## 2021-09-03 LAB — SURGICAL PATHOLOGY

## 2021-09-10 ENCOUNTER — Ambulatory Visit
Admission: RE | Admit: 2021-09-10 | Discharge: 2021-09-10 | Disposition: A | Discharge status: Home / Self Care | Payer: BC Managed Care – PPO | Source: Ambulatory Visit | Attending: Family Medicine | Admitting: Family Medicine

## 2021-09-10 ENCOUNTER — Other Ambulatory Visit: Payer: Self-pay | Admitting: Family Medicine

## 2021-09-10 DIAGNOSIS — M7989 Other specified soft tissue disorders: Secondary | ICD-10-CM | POA: Diagnosis present

## 2021-09-10 DIAGNOSIS — M79662 Pain in left lower leg: Secondary | ICD-10-CM | POA: Diagnosis present

## 2021-09-28 ENCOUNTER — Encounter: Payer: Self-pay | Admitting: General Surgery

## 2021-11-27 ENCOUNTER — Ambulatory Visit
Admission: RE | Admit: 2021-11-27 | Discharge: 2021-11-27 | Disposition: A | Discharge status: Home / Self Care | Payer: BC Managed Care – PPO | Source: Ambulatory Visit | Attending: Internal Medicine | Admitting: Internal Medicine

## 2021-11-27 DIAGNOSIS — D751 Secondary polycythemia: Secondary | ICD-10-CM | POA: Diagnosis present

## 2022-02-06 ENCOUNTER — Other Ambulatory Visit: Payer: Self-pay | Admitting: Infectious Diseases

## 2022-02-06 ENCOUNTER — Ambulatory Visit
Admission: RE | Admit: 2022-02-06 | Discharge: 2022-02-06 | Disposition: A | Discharge status: Home / Self Care | Payer: BC Managed Care – PPO | Source: Ambulatory Visit | Attending: Internal Medicine | Admitting: Internal Medicine

## 2022-02-06 DIAGNOSIS — D751 Secondary polycythemia: Secondary | ICD-10-CM | POA: Diagnosis not present

## 2022-02-06 DIAGNOSIS — R2242 Localized swelling, mass and lump, left lower limb: Secondary | ICD-10-CM

## 2022-02-06 DIAGNOSIS — R599 Enlarged lymph nodes, unspecified: Secondary | ICD-10-CM

## 2022-02-06 DIAGNOSIS — R1012 Left upper quadrant pain: Secondary | ICD-10-CM

## 2022-02-06 DIAGNOSIS — R509 Fever, unspecified: Secondary | ICD-10-CM

## 2022-02-28 ENCOUNTER — Ambulatory Visit
Admission: RE | Admit: 2022-02-28 | Discharge: 2022-02-28 | Disposition: A | Discharge status: Home / Self Care | Payer: BC Managed Care – PPO | Source: Ambulatory Visit | Attending: Infectious Diseases | Admitting: Infectious Diseases

## 2022-02-28 DIAGNOSIS — R1012 Left upper quadrant pain: Secondary | ICD-10-CM

## 2022-02-28 DIAGNOSIS — R599 Enlarged lymph nodes, unspecified: Secondary | ICD-10-CM

## 2022-02-28 DIAGNOSIS — R2242 Localized swelling, mass and lump, left lower limb: Secondary | ICD-10-CM

## 2022-02-28 DIAGNOSIS — R509 Fever, unspecified: Secondary | ICD-10-CM

## 2022-02-28 MED ORDER — IOPAMIDOL (ISOVUE-300) INJECTION 61%
100.0000 mL | Freq: Once | INTRAVENOUS | Status: AC | PRN
  Administered 2022-02-28: 100 mL via INTRAVENOUS

## 2022-04-29 ENCOUNTER — Other Ambulatory Visit: Payer: Self-pay

## 2022-04-29 DIAGNOSIS — R2242 Localized swelling, mass and lump, left lower limb: Secondary | ICD-10-CM

## 2022-04-30 ENCOUNTER — Other Ambulatory Visit: Payer: Self-pay | Admitting: Internal Medicine

## 2022-04-30 DIAGNOSIS — R2242 Localized swelling, mass and lump, left lower limb: Secondary | ICD-10-CM

## 2022-05-15 ENCOUNTER — Ambulatory Visit
Admission: RE | Admit: 2022-05-15 | Discharge: 2022-05-15 | Disposition: A | Discharge status: Home / Self Care | Payer: BC Managed Care – PPO | Source: Ambulatory Visit | Attending: Internal Medicine | Admitting: Internal Medicine

## 2022-05-15 DIAGNOSIS — R2242 Localized swelling, mass and lump, left lower limb: Secondary | ICD-10-CM

## 2022-05-15 MED ORDER — GADOPICLENOL 0.5 MMOL/ML IV SOLN
7.5000 mL | Freq: Once | INTRAVENOUS | Status: AC | PRN
  Administered 2022-05-15: 7.5 mL via INTRAVENOUS

## 2022-06-05 ENCOUNTER — Ambulatory Visit: Payer: BC Managed Care – PPO | Admitting: Dermatology

## 2022-06-05 VITALS — BP 119/82 | HR 67

## 2022-06-05 DIAGNOSIS — D1801 Hemangioma of skin and subcutaneous tissue: Secondary | ICD-10-CM

## 2022-06-05 DIAGNOSIS — L739 Follicular disorder, unspecified: Secondary | ICD-10-CM | POA: Diagnosis not present

## 2022-06-05 MED ORDER — CLINDAMYCIN PHOSPHATE 1 % EX GEL: CUTANEOUS | 3 refills | Status: DC

## 2022-06-05 MED ORDER — DOXYCYCLINE MONOHYDRATE 100 MG PO CAPS: 100.0000 mg | ORAL_CAPSULE | Freq: Every day | ORAL | 3 refills | Status: DC

## 2022-06-05 NOTE — Patient Instructions (Addendum)
Recommend OTC benzoyl peroxide cleanser, wash affected areas daily in shower, let sit several minutes prior to rinsing.  May bleach towels if not rinsed off completely.  Recommended brands include Panoxyl 4% Creamy Wash, CeraVe Acne Foaming Cream wash, or Cetaphil Gentle Clear Complexion-Clearing BPO Acne Cleanser. Benzoyl peroxide can cause dryness and irritation of the skin. It can also bleach fabric. When used together with Aczone (dapsone) cream, it can stain the skin orange.  Start doxycycline  - take 1 by mouth daily with food. Doxycycline should be taken with food to prevent nausea. Do not lay down for 30 minutes after taking. Be cautious with sun exposure and use good sun protection while on this medication. Pregnant women should not take this medication.   Start Clindamycin Gel - Apply to affected areas once daily after shower.   Due to recent changes in healthcare laws, you may see results of your pathology and/or laboratory studies on MyChart before the doctors have had a chance to review them. We understand that in some cases there may be results that are confusing or concerning to you. Please understand that not all results are received at the same time and often the doctors may need to interpret multiple results in order to provide you with the best plan of care or course of treatment. Therefore, we ask that you please give [place] 2 business days to thoroughly review all your results before contacting the office for clarification. Should we see a critical lab result, you will be contacted sooner.   If You Need Anything After Your Visit  If you have any questions or concerns for your doctor, please call our main line at [phone] and press option 4 to reach your doctor's medical assistant. If no one answers, please leave a voicemail as directed and we will return your call as soon as possible. Messages left after 4 pm will be answered the following business day.   You may also send [place]  a message via MyChart. We typically respond to MyChart messages within 1-2 business days.  For prescription refills, please ask your pharmacy to contact our office. Our fax number is [phone].  If you have an urgent issue when the clinic is closed that cannot wait until the next business day, you can page your doctor at the number below.    Please note that while we do our best to be available for urgent issues outside of office hours, we are not available 24/7.   If you have an urgent issue and are unable to reach [place], you may choose to seek medical care at your doctor's office, retail clinic, urgent care center, or emergency room.  If you have a medical emergency, please immediately call 911 or go to the emergency department.  Pager Numbers  - Dr. Gwen Pounds: [phone]  - Dr. Neale Burly: [phone]  - Dr. Roseanne Reno: [phone]  In the event of inclement weather, please call our main line at [phone] for an update on the status of any delays or closures.  Dermatology Medication Tips: Please keep the boxes that topical medications come in in order to help keep track of the instructions about where and how to use these. Pharmacies typically print the medication instructions only on the boxes and not directly on the medication tubes.   If your medication is too expensive, please contact our office at [phone] option 4 or send [place] a message through MyChart.   We are unable to tell what your co-pay for medications will be  in advance as this is different depending on your insurance coverage. However, we may be able to find a substitute medication at lower cost or fill out paperwork to get insurance to cover a needed medication.   If a prior authorization is required to get your medication covered by your insurance company, please allow [place] 1-2 business days to complete this process.  Drug prices often vary depending on where the prescription is filled and some pharmacies may offer  cheaper prices.  The website www.goodrx.com contains coupons for medications through different pharmacies. The prices here do not account for what the cost may be with help from insurance (it may be cheaper with your insurance), but the website can give you the price if you did not use any insurance.  - You can print the associated coupon and take it with your prescription to the pharmacy.  - You may also stop by our office during regular business hours and pick up a GoodRx coupon card.  - If you need your prescription sent electronically to a different pharmacy, notify our office through [facility] or by phone at [phone] option 4.     Si Usted Necesita Algo Despus de Su Visita  Tambin puede enviarnos un mensaje a travs de Clinical cytogeneticist. Por lo general respondemos a los mensajes de MyChart en el transcurso de 1 a 2 das hbiles.  Para renovar recetas, por favor pida a su farmacia que se ponga en contacto con nuestra oficina. Annie Sable de fax es [place] [phone].  Si tiene un asunto urgente cuando la clnica est cerrada y que no puede esperar hasta el siguiente da hbil, puede llamar/localizar a su doctor(a) al nmero que aparece a continuacin.   Por favor, tenga en cuenta que aunque hacemos todo lo posible para estar disponibles para asuntos urgentes fuera del horario de [place], no estamos disponibles las 24 horas del da, los 7 [address]  [address] de la [place].   Si tiene un problema urgente y no puede comunicarse con nosotros, puede optar por buscar atencin mdica  en el consultorio de su doctor(a), en una clnica privada, en un centro de atencin urgente o en una sala de emergencias.  Si tiene Engineer, drilling, por favor llame inmediatamente al 911 o vaya a la sala de emergencias.  Nmeros de bper  - Dr. Gwen Pounds: [phone]  - Dra. Moye: [phone]  - Dra. Roseanne Reno: [phone]  En caso de inclemencias del [place], por favor llame a Lacy Duverney principal al  [phone] para una actualizacin sobre el [place] de cualquier retraso o cierre.  Consejos para la medicacin en dermatologa: Por favor, guarde las cajas en las que vienen los medicamentos de uso tpico para ayudarle a seguir las instrucciones sobre dnde y cmo usarlos. Las farmacias generalmente imprimen las instrucciones del medicamento slo en las cajas y no directamente en los tubos del [place].   Si su medicamento es muy caro, por favor, pngase en contacto con Rolm Gala llamando al [phone] y presione la opcin 4 o envenos un mensaje a travs de Clinical cytogeneticist.   No podemos decirle cul ser su copago por los medicamentos por adelantado ya que esto es diferente dependiendo de la cobertura de su seguro. Sin embargo, es posible que podamos encontrar un medicamento sustituto a Audiological scientist un formulario para que el seguro cubra el medicamento que se considera necesario.   Si se requiere una autorizacin previa para que su compaa de seguros [place] su medicamento, por favor permtanos de 1 a  2 das hbiles para completar este proceso.  Los precios de los medicamentos varan con frecuencia dependiendo del Environmental consultant de dnde se surte la receta y alguna farmacias pueden ofrecer precios ms baratos.  El sitio web www.goodrx.com tiene cupones para medicamentos de Airline pilot. Los precios aqu no tienen en cuenta lo que podra costar con la ayuda del seguro (puede ser ms barato con su seguro), pero el sitio web puede darle el precio si no utiliz Research scientist (physical sciences).  - Puede imprimir el cupn correspondiente y llevarlo con su receta a la farmacia.  - Tambin puede pasar por nuestra oficina durante el horario de atencin regular y Charity fundraiser una tarjeta de cupones de GoodRx.  - Si necesita que su receta se enve electrnicamente a una farmacia diferente, informe a nuestra oficina a travs de MyChart de [place] o por telfono llamando al [phone] y presione la opcin 4.

## 2022-06-05 NOTE — Progress Notes (Signed)
   Follow Up Visit   Subjective  John Davis is a 37 y.o. male who presents for the following: Rash of the abdomen and back that came up 3 months ago, not itchy. No new laundry detergent or soap. No new medicines. Rash came up after he stopped antibiotic treatment for atypical mycobacterium. He also has a red spot on the left scalp that he noticed 2 months ago.  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     The following portions of the chart were reviewed this encounter and updated as appropriate: medications, allergies, medical history  Review of Systems:  No other skin or systemic complaints except as noted in HPI or Assessment and Plan.  Objective  Well appearing patient in no apparent distress; mood and affect are within normal limits.  A focused examination was performed of the following areas: Face, trunk, scalp  Relevant exam findings are noted in the Assessment and Plan.    Assessment & Plan   FOLLICULITIS Exam: Perifollicular erythematous papules of the abdomen, back   Treatment Plan: Chronic and persistent condition with duration or expected duration over one year. Condition is bothersome/symptomatic for patient. Currently flared.   Start doxycycline 100 MG take 1 po QD with food dsp #30 3 Doxycycline should be taken with food to prevent nausea. Do not lay down for 30 minutes after taking. Be cautious with sun exposure and use good sun protection while on this medication. Pregnant women should not take this medication.   Start Clindamycin Gel apply to AA QD after shower dsp 75mL 3Rf.  Recommend OTC benzoyl peroxide cleanser, wash affected areas daily in shower, let sit several minutes prior to rinsing.  May bleach towels if not rinsed off completely.  Recommended brands include Panoxyl 4% Creamy Wash, CeraVe Acne Foaming Cream wash, or Cetaphil Gentle Clear Complexion-Clearing  BPO Acne Cleanser.   HEMANGIOMA Exam: red papule of the left scalp Discussed benign nature. Recommend observation. Call for changes.  Return in about 2 months (around 08/05/2022) for Folliculitis.  ICherlyn Labella, CMA, am acting as scribe for Willeen Niece, MD .   Documentation: I have reviewed the above documentation for accuracy and completeness, and I agree with the above.  Willeen Niece, MD

## 2022-08-05 ENCOUNTER — Other Ambulatory Visit: Payer: Self-pay | Admitting: Physician Assistant

## 2022-08-05 ENCOUNTER — Ambulatory Visit: Payer: BC Managed Care – PPO | Admitting: Dermatology

## 2022-08-05 ENCOUNTER — Encounter: Payer: Self-pay | Admitting: Dermatology

## 2022-08-05 VITALS — BP 133/81 | HR 76

## 2022-08-05 DIAGNOSIS — R21 Rash and other nonspecific skin eruption: Secondary | ICD-10-CM | POA: Diagnosis not present

## 2022-08-05 DIAGNOSIS — L739 Follicular disorder, unspecified: Secondary | ICD-10-CM | POA: Diagnosis not present

## 2022-08-05 DIAGNOSIS — L309 Dermatitis, unspecified: Secondary | ICD-10-CM

## 2022-08-05 DIAGNOSIS — R1013 Epigastric pain: Secondary | ICD-10-CM

## 2022-08-05 DIAGNOSIS — R1012 Left upper quadrant pain: Secondary | ICD-10-CM

## 2022-08-05 MED ORDER — FLUCONAZOLE 150 MG PO TABS: 150.0000 mg | ORAL_TABLET | Freq: Every day | ORAL | 0 refills | Status: AC

## 2022-08-05 MED ORDER — MOMETASONE FUROATE 0.1 % EX CREA: TOPICAL_CREAM | CUTANEOUS | 1 refills | Status: DC

## 2022-08-05 MED ORDER — KETOCONAZOLE 2 % EX SHAM: MEDICATED_SHAMPOO | CUTANEOUS | 2 refills | Status: DC

## 2022-08-05 NOTE — Progress Notes (Signed)
   Follow-Up Visit   Subjective  John Davis is a 37 y.o. male who presents for the following: 2 month follow-up folliculitis of the abdomen and back. Patient states he is about the same and getting new spots. He takes doxycycline 100 MG daily and uses clindamycin gel and BP wash. Areas are not itchy. He was outside a lot last week. Patient doesn't take any OTC supplements or androgens. Patient sweats a lot at night while sleeping. Rash started around January 2024 after he stopped antibiotic treatment for atypical mycobacteria infection. He does ride a bicycle, but doesn't wear tight fitting clothing. He also scuba dives year round- used to wear wet suit, but now wears dry suit. Denies sweating under suit and says it isn't tight. Pt has h/o atypical myco bacteria of L thigh.  He was on prolonged antibiotic therapy to treat that.   The following portions of the chart were reviewed this encounter and updated as appropriate: medications, allergies, medical history  Review of Systems:  No other skin or systemic complaints except as noted in HPI or Assessment and Plan.  Objective  Well appearing patient in no apparent distress; mood and affect are within normal limits.  A focused examination was performed of the following areas: trunk Relevant physical exam findings are noted in the Assessment and Plan.    Assessment & Plan   Rash  Related Procedures Anaerobic and Aerobic Culture   FOLLICULITIS vs ACNE Possibly related to regular scuba diving vrs sweating at night r/o gram neg or pityrosporum folliculitis  Chronic and persistent condition with duration or expected duration over one year. Condition is bothersome/symptomatic for patient. Currently flared. Not responding to oral/topical antibiotics  Exam: Perifollicular erythematous papules and pustules  Treatment Plan: d/c BP wash, doxycycline, clindamycin Start ketoconazole 2% shampoo Apply to the trunk once daily, using as a body  wash, let sit several min prior to rinsing Start fluconazole 150 MG take 1 po every day x 7 days dsp #7 0Rf.  Bacterial Culture performed today.   Recommend loose clothing, shower after sweating   Hand Dermatitis  Exam:  4th webspace with erythema and scale  Chronic and persistent condition with duration or expected duration over one year. Condition is bothersome/symptomatic for patient. Currently flared.   Hand Dermatitis is a chronic type of eczema that can come and go on the hands and fingers.  While there is no cure, the rash and symptoms can be managed with topical prescription medications, and for more severe cases, with systemic medications.  Recommend mild soap and routine use of moisturizing cream after handwashing.  Minimize soap/water exposure when possible.     Treatment Plan: Start mometasone cream Apply to AA rash on hands BID until improved    Return 4-6 weeks, for folliculitis.  ICherlyn Labella, CMA, am acting as scribe for Willeen Niece, MD .   Documentation: I have reviewed the above documentation for accuracy and completeness, and I agree with the above.  Willeen Niece, MD

## 2022-08-05 NOTE — Patient Instructions (Signed)
Due to recent changes in healthcare laws, you may see results of your pathology and/or laboratory studies on MyChart before the doctors have had a chance to review them. We understand that in some cases there may be results that are confusing or concerning to you. Please understand that not all results are received at the same time and often the doctors may need to interpret multiple results in order to provide you with the best plan of care or course of treatment. Therefore, we ask that you please give us 2 business days to thoroughly review all your results before contacting the office for clarification. Should we see a critical lab result, you will be contacted sooner.   If You Need Anything After Your Visit  If you have any questions or concerns for your doctor, please call our main line at [phone] and press option 4 to reach your doctor's medical assistant. If no one answers, please leave a voicemail as directed and we will return your call as soon as possible. Messages left after 4 pm will be answered the following business day.   You may also send us a message via MyChart. We typically respond to MyChart messages within 1-2 business days.  For prescription refills, please ask your pharmacy to contact our office. Our fax number is [phone].  If you have an urgent issue when the clinic is closed that cannot wait until the next business day, you can page your doctor at the number below.    Please note that while we do our best to be available for urgent issues outside of office hours, we are not available 24/7.   If you have an urgent issue and are unable to reach us, you may choose to seek medical care at your doctor's office, retail clinic, urgent care center, or emergency room.  If you have a medical emergency, please immediately call 911 or go to the emergency department.  Pager Numbers  - Dr. Kowalski: [phone]  - Dr. Moye: [phone]  - Dr. Stewart:  [phone]  In the event of inclement weather, please call our main line at [phone] for an update on the status of any delays or closures.  Dermatology Medication Tips: Please keep the boxes that topical medications come in in order to help keep track of the instructions about where and how to use these. Pharmacies typically print the medication instructions only on the boxes and not directly on the medication tubes.   If your medication is too expensive, please contact our office at [phone] option 4 or send us a message through MyChart.   We are unable to tell what your co-pay for medications will be in advance as this is different depending on your insurance coverage. However, we may be able to find a substitute medication at lower cost or fill out paperwork to get insurance to cover a needed medication.   If a prior authorization is required to get your medication covered by your insurance company, please allow us 1-2 business days to complete this process.  Drug prices often vary depending on where the prescription is filled and some pharmacies may offer cheaper prices.  The website www.goodrx.com contains coupons for medications through different pharmacies. The prices here do not account for what the cost may be with help from insurance (it may be cheaper with your insurance), but the website can give you the price if you did not use any insurance.  - You can print the associated coupon and take it with   your prescription to the pharmacy.  - You may also stop by our office during regular business hours and pick up a GoodRx coupon card.  - If you need your prescription sent electronically to a different pharmacy, notify our office through [facility] MyChart or by phone at [phone] option 4.     Si Usted Necesita Algo Despus de Su Visita  Tambin puede enviarnos un mensaje a travs de MyChart. Por lo general respondemos a los mensajes de MyChart en el transcurso de 1 a 2  das hbiles.  Para renovar recetas, por favor pida a su farmacia que se ponga en contacto con nuestra oficina. Nuestro nmero de fax es el [phone].  Si tiene un asunto urgente cuando la clnica est cerrada y que no puede esperar hasta el siguiente da hbil, puede llamar/localizar a su doctor(a) al nmero que aparece a continuacin.   Por favor, tenga en cuenta que aunque hacemos todo lo posible para estar disponibles para asuntos urgentes fuera del horario de oficina, no estamos disponibles las 24 horas del da, los 7 das de la semana.   Si tiene un problema urgente y no puede comunicarse con nosotros, puede optar por buscar atencin mdica  en el consultorio de su doctor(a), en una clnica privada, en un centro de atencin urgente o en una sala de emergencias.  Si tiene una emergencia mdica, por favor llame inmediatamente al 911 o vaya a la sala de emergencias.  Nmeros de bper  - Dr. Kowalski: [phone]  - Dra. Moye: [phone]  - Dra. Stewart: [phone]  En caso de inclemencias del tiempo, por favor llame a nuestra lnea principal al [phone] para una actualizacin sobre el estado de cualquier retraso o cierre.  Consejos para la medicacin en dermatologa: Por favor, guarde las cajas en las que vienen los medicamentos de uso tpico para ayudarle a seguir las instrucciones sobre dnde y cmo usarlos. Las farmacias generalmente imprimen las instrucciones del medicamento slo en las cajas y no directamente en los tubos del medicamento.   Si su medicamento es muy caro, por favor, pngase en contacto con nuestra oficina llamando al [phone] y presione la opcin 4 o envenos un mensaje a travs de MyChart.   No podemos decirle cul ser su copago por los medicamentos por adelantado ya que esto es diferente dependiendo de la cobertura de su seguro. Sin embargo, es posible que podamos encontrar un medicamento sustituto a menor costo o llenar un formulario para que el  seguro cubra el medicamento que se considera necesario.   Si se requiere una autorizacin previa para que su compaa de seguros cubra su medicamento, por favor permtanos de 1 a 2 das hbiles para completar este proceso.  Los precios de los medicamentos varan con frecuencia dependiendo del lugar de dnde se surte la receta y alguna farmacias pueden ofrecer precios ms baratos.  El sitio web www.goodrx.com tiene cupones para medicamentos de diferentes farmacias. Los precios aqu no tienen en cuenta lo que podra costar con la ayuda del seguro (puede ser ms barato con su seguro), pero el sitio web puede darle el precio si no utiliz ningn seguro.  - Puede imprimir el cupn correspondiente y llevarlo con su receta a la farmacia.  - Tambin puede pasar por nuestra oficina durante el horario de atencin regular y recoger una tarjeta de cupones de GoodRx.  - Si necesita que su receta se enve electrnicamente a una farmacia diferente, informe a nuestra oficina a travs de MyChart de [place]   o por telfono llamando al [phone] y presione la opcin 4.  

## 2022-08-07 ENCOUNTER — Ambulatory Visit
Admission: RE | Admit: 2022-08-07 | Discharge: 2022-08-07 | Disposition: A | Discharge status: Home / Self Care | Payer: BC Managed Care – PPO | Source: Ambulatory Visit | Attending: Internal Medicine | Admitting: Internal Medicine

## 2022-08-07 DIAGNOSIS — D751 Secondary polycythemia: Secondary | ICD-10-CM | POA: Insufficient documentation

## 2022-08-07 NOTE — Progress Notes (Signed)
Pt here for therapeutic phlebotomy. Finished at 0935 and went home.

## 2022-08-09 LAB — ANAEROBIC AND AEROBIC CULTURE

## 2022-08-12 ENCOUNTER — Ambulatory Visit
Admission: RE | Admit: 2022-08-12 | Discharge: 2022-08-12 | Disposition: A | Discharge status: Home / Self Care | Payer: BC Managed Care – PPO | Source: Ambulatory Visit | Attending: Physician Assistant | Admitting: Physician Assistant

## 2022-08-12 ENCOUNTER — Telehealth: Payer: Self-pay

## 2022-08-12 DIAGNOSIS — R1012 Left upper quadrant pain: Secondary | ICD-10-CM

## 2022-08-12 DIAGNOSIS — R1013 Epigastric pain: Secondary | ICD-10-CM

## 2022-08-12 MED ORDER — IOPAMIDOL (ISOVUE-300) INJECTION 61%
100.0000 mL | Freq: Once | INTRAVENOUS | Status: AC | PRN
  Administered 2022-08-12: 100 mL via INTRAVENOUS

## 2022-08-12 MED ORDER — SULFACETAMIDE SOD-SULFUR WASH 9-4.5 % EX KIT: 1.0000 | PACK | CUTANEOUS | 2 refills | Status: DC

## 2022-08-12 MED ORDER — SULFAMETHOXAZOLE-TRIMETHOPRIM 800-160 MG PO TABS: 1.0000 | ORAL_TABLET | Freq: Two times a day (BID) | ORAL | 0 refills | Status: AC

## 2022-08-12 NOTE — Telephone Encounter (Signed)
-----   Message from Tara Stewart, MD sent at 08/12/2022  2:05 PM EDT ----- Positive for Cutibacterium acnes (acne causing bacteria).  Resistant to TCN/Doxy.  It is sensitive to Sulfa antibiotic, and will send in Bactrim DS PO twice daily for 10 days, and Sulfacetamide/sulfur (antibacterial) wash daily in shower.  Let sit several minutes prior to rinsing.  If not improved, I would recommend a course of isotretinoin and can discuss and possibly start at his next appointment.     - please call patient 

## 2022-08-12 NOTE — Telephone Encounter (Signed)
-----   Message from Willeen Niece, MD sent at 08/12/2022  2:05 PM EDT ----- Positive for Cutibacterium acnes (acne causing bacteria).  Resistant to TCN/Doxy.  It is sensitive to Sulfa antibiotic, and will send in Bactrim DS PO twice daily for 10 days, and Sulfacetamide/sulfur (antibacterial) wash daily in shower.  Let sit several minutes prior to rinsing.  If not improved, I would recommend a course of isotretinoin and can discuss and possibly start at his next appointment.     - please call patient

## 2022-08-12 NOTE — Telephone Encounter (Signed)
Advised pt of culture results.  Advised pt to d/c Doxycycline and to start Bacrim DS 1 po bid for 10 days and to start Sulfacetamide/sulfur wash every day in shower, let sit a few minutes and rinse off.  Pt asked about continuing the Fluconazole.  I advised to d/c for now and if Dr. Roseanne Reno would like him to continue I would call and advise./sh

## 2022-08-13 NOTE — Telephone Encounter (Signed)
Advised pt that he could finish the 3 days of Fluconazole./sh

## 2022-08-16 ENCOUNTER — Ambulatory Visit
Admission: RE | Admit: 2022-08-16 | Discharge: 2022-08-16 | Disposition: A | Discharge status: Home / Self Care | Payer: BC Managed Care – PPO | Source: Ambulatory Visit | Attending: Internal Medicine | Admitting: Internal Medicine

## 2022-08-16 DIAGNOSIS — D751 Secondary polycythemia: Secondary | ICD-10-CM | POA: Insufficient documentation

## 2022-09-02 ENCOUNTER — Encounter: Payer: Self-pay | Admitting: Dermatology

## 2022-09-16 ENCOUNTER — Encounter: Payer: Self-pay | Admitting: Dermatology

## 2022-09-16 ENCOUNTER — Ambulatory Visit: Payer: BC Managed Care – PPO | Admitting: Dermatology

## 2022-09-16 VITALS — BP 147/83 | HR 61

## 2022-09-16 DIAGNOSIS — L739 Follicular disorder, unspecified: Secondary | ICD-10-CM

## 2022-09-16 DIAGNOSIS — L738 Other specified follicular disorders: Secondary | ICD-10-CM | POA: Diagnosis not present

## 2022-09-16 DIAGNOSIS — Z7189 Other specified counseling: Secondary | ICD-10-CM | POA: Diagnosis not present

## 2022-09-16 DIAGNOSIS — L309 Dermatitis, unspecified: Secondary | ICD-10-CM | POA: Diagnosis not present

## 2022-09-16 MED ORDER — MINOCYCLINE HCL 100 MG PO CAPS: ORAL_CAPSULE | ORAL | 1 refills | Status: DC

## 2022-09-16 NOTE — Progress Notes (Signed)
Follow-Up Visit   Subjective  John Davis is a 37 y.o. male who presents for the following: folliculitis of abdomen and back.He has been using ketoconazole 2% shampoo as a body wash daily. He thinks it is keeping things stable but not clearing it.He did take fluconazole for a week but that didn't help. Doxycycline did not help either. Pt had culture that was Positive for Cutibacterium acnes. Pt told to take Bactrim DS PO twice daily for 10 days but unable to complete due to GI upset., and use Sulfacetamide/sulfur (antibacterial) wash daily in shower. He saw no change after this treatment.  Pt also here for hand dermatitis. Pt started mometasone cream and was using it bid but went down to daily last week as he has seen improvement.  The following portions of the chart were reviewed this encounter and updated as appropriate: medications, allergies, medical history  Review of Systems:  No other skin or systemic complaints except as noted in HPI or Assessment and Plan.  Objective  Well appearing patient in no apparent distress; mood and affect are within normal limits.   A focused examination was performed of the following areas: Abdomen and back  Relevant exam findings are noted in the Assessment and Plan.    Assessment & Plan   ACNE FOLLICULITIS Exacerbated by regular scuba diving and/or sweating at night.  Culture showed Cutibacterium acnes  Exam: scattered follicular pink papules on the back.  Chronic and persistent condition with duration or expected duration over one year. Condition is symptomatic/ bothersome to patient. Not currently at goal.   Folliculitis occurs due to inflammation of the superficial hair follicle (pore), resulting in acne-like lesions (pus bumps). It can be infectious (bacterial, fungal) or noninfectious (shaving, tight clothing, heat/sweat, medications).  Folliculitis can be acute or chronic and recommended treatment depends on the underlying cause of  folliculitis.   Treatment Plan: D/C ketoconazole 2% shampoo -Continue Sulfacetamide/sulfur (antibacterial) wash daily in shower. 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   Discussed starting Accutane with pt and all the risks and benefits.  Pt prefers to to start this in the fall.  Isotretinoin Counseling; Review and Contraception Counseling: Reviewed potential side effects of isotretinoin including xerosis, cheilitis, hepatitis, hyperlipidemia, and severe birth defects if taken by a pregnant woman.  Women on isotretinoin must be celibate (not having sex) or required to use at least 2 birth control methods to prevent pregnancy (unless patient is a male of non-child bearing potential).  Females of child-bearing potential must have monthly pregnancy tests while on isotretinoin and report through [employer] (FDA monitoring program). Reviewed reports of suicidal ideation in those with a history of depression while taking isotretinoin and reports of diagnosis of inflammatory bowl disease (IBD) while taking isotretinoin as well as the lack of evidence for a causal relationship between isotretinoin, depression and IBD. Patient advised to reach out with any questions or concerns. Patient advised not to share pills or donate blood while on treatment or for one month after completing treatment. All patient's considering Isotretinoin must read and understand and sign Isotretinoin Consent Form and be registered with [employer].    Hand Dermatitis   Exam:  4th webspace left hand with erythema and scale   Chronic condition with duration or expected  duration over one year. Currently well-controlled.    Hand Dermatitis is a chronic type of eczema that can come and go on  the hands and fingers.  While there is no cure, the rash and symptoms can be managed with topical prescription medications, and for more severe cases, with systemic medications.  Recommend mild soap and routine use of moisturizing cream after handwashing.  Minimize soap/water exposure when possible.      Treatment Plan: -Continue mometasone cream Apply to AA rash on hands daily as needed    Return in about 3 months (around 12/17/2022) for ACNE.  I, Tillie Fantasia, CMA, am acting as scribe for Willeen Niece, MD.   Documentation: I have reviewed the above documentation for accuracy and completeness, and I agree with the above.  Willeen Niece, MD

## 2022-09-16 NOTE — Patient Instructions (Addendum)
Folliculitis/Acne  Treatment Plan: D/C ketoconazole 2% shampoo Apply to the trunk once daily -Continue Sulfacetamide/sulfur (antibacterial) wash daily in shower. -Start Minocycline 100mg  once a day, can go up to bid -Start OTC Benzoyl peroxide wipes daily   Hand Dermatitis  -Continue mometasone cream Apply to AA rash on hands daily as needed  Due to recent changes in healthcare laws, you may see results of your pathology and/or laboratory studies on MyChart before the doctors have had a chance to review them. We understand that in some cases there may be results that are confusing or concerning to you. Please understand that not all results are received at the same time and often the doctors may need to interpret multiple results in order to provide you with the best plan of care or course of treatment. Therefore, we ask that you please give [place] 2 business days to thoroughly review all your results before contacting the office for clarification. Should we see a critical lab result, you will be contacted sooner.   If You Need Anything After Your Visit  If you have any questions or concerns for your doctor, please call our main line at [phone] If no one answers, please leave a voicemail as directed and we will return your call as soon as possible. Messages left after 4 pm will be answered the following business day.   You may also send [place] a message via MyChart. We typically respond to MyChart messages within 1-2 business days.  For prescription refills, please ask your pharmacy to contact our office. Our fax number is [phone].  If you have an urgent issue when the clinic is closed that cannot wait until the next business day, you can page your doctor at the number below.    Please note that while we do our best to be available for urgent issues outside of office hours, we are not available 24/7.   If you have an urgent issue and are unable to reach [place], you may choose to seek medical  care at your doctor's office, retail clinic, urgent care center, or emergency room.  If you have a medical emergency, please immediately call 911 or go to the emergency department. In the event of inclement weather, please call our main line at [phone] for an update on the status of any delays or closures.  Dermatology Medication Tips: Please keep the boxes that topical medications come in in order to help keep track of the instructions about where and how to use these. Pharmacies typically print the medication instructions only on the boxes and not directly on the medication tubes.      If your medication is too expensive, please contact our office at [phone] or send [place] a message through MyChart.   We are unable to tell what your co-pay for medications will be in advance as this is different depending on your insurance coverage. However, we may be able to find a substitute medication at lower cost or fill out paperwork to get insurance to cover a needed medication.   If a prior authorization is required to get your medication covered by your insurance company, please allow [place] 1-2 business days to complete this process.  Drug prices often vary depending on where the prescription is filled and some pharmacies may offer cheaper prices.  The website www.goodrx.com contains coupons for medications through different pharmacies. The prices here do not account for what the cost may be with help from insurance (it may be cheaper with your  insurance), but the website can give you the price if you did not use any insurance.  - You can print the associated coupon and take it with your prescription to the pharmacy.  - You may also stop by our office during regular business hours and pick up a GoodRx coupon card.  - If you need your prescription sent electronically to a different pharmacy, notify our office through [facility] or by phone at [phone]

## 2022-11-11 ENCOUNTER — Other Ambulatory Visit: Payer: Self-pay | Admitting: Internal Medicine

## 2022-11-11 DIAGNOSIS — R042 Hemoptysis: Secondary | ICD-10-CM

## 2022-11-11 DIAGNOSIS — R053 Chronic cough: Secondary | ICD-10-CM

## 2022-11-12 ENCOUNTER — Inpatient Hospital Stay
Admission: RE | Admit: 2022-11-12 | Discharge: 2022-11-12 | Disposition: A | Discharge status: Home / Self Care | Payer: BC Managed Care – PPO | Source: Ambulatory Visit | Attending: Internal Medicine | Admitting: Internal Medicine

## 2022-11-12 DIAGNOSIS — R042 Hemoptysis: Secondary | ICD-10-CM

## 2022-11-12 DIAGNOSIS — R053 Chronic cough: Secondary | ICD-10-CM

## 2022-11-12 MED ORDER — IOPAMIDOL (ISOVUE-370) INJECTION 76%
75.0000 mL | Freq: Once | INTRAVENOUS | Status: AC | PRN
  Administered 2022-11-12: 75 mL via INTRAVENOUS

## 2022-11-19 ENCOUNTER — Ambulatory Visit
Admission: RE | Admit: 2022-11-19 | Discharge: 2022-11-19 | Disposition: A | Discharge status: Home / Self Care | Payer: BC Managed Care – PPO | Source: Ambulatory Visit | Attending: Internal Medicine | Admitting: Internal Medicine

## 2022-11-19 VITALS — BP 125/90 | HR 60 | Temp 97.8°F | Resp 16

## 2022-11-19 DIAGNOSIS — D751 Secondary polycythemia: Secondary | ICD-10-CM | POA: Diagnosis present

## 2022-12-31 ENCOUNTER — Encounter: Payer: Self-pay | Admitting: Dermatology

## 2022-12-31 ENCOUNTER — Ambulatory Visit: Payer: BC Managed Care – PPO | Admitting: Dermatology

## 2022-12-31 DIAGNOSIS — L739 Follicular disorder, unspecified: Secondary | ICD-10-CM | POA: Diagnosis not present

## 2022-12-31 MED ORDER — MINOCYCLINE HCL 100 MG PO CAPS: ORAL_CAPSULE | ORAL | 1 refills | Status: AC

## 2022-12-31 MED ORDER — AMZEEQ 4 % EX FOAM: CUTANEOUS | 2 refills | Status: AC

## 2022-12-31 NOTE — Patient Instructions (Addendum)
Continue Minocycline 100 mg once daily, increase to twice daily if not effective.   Recommend OTC Benzoyl peroxide wash daily in shower.  Start Amzeeq daily to affected areas once daily.   Benzoyl peroxide can cause dryness and irritation of the skin. It can also bleach fabric.   Your prescription Vergia Alberts) was sent to [facility] in [place]. A representative from [facility] will contact you within 3 business hours to verify your address and insurance information to schedule a free delivery. If for any reason you do not receive a phone call from them, please reach out to them. Their phone number is [phone] and their hours are Monday-Friday 9:00 am-5:00 pm.      Due to recent changes in healthcare laws, you may see results of your pathology and/or laboratory studies on MyChart before the doctors have had a chance to review them. We understand that in some cases there may be results that are confusing or concerning to you. Please understand that not all results are received at the same time and often the doctors may need to interpret multiple results in order to provide you with the best plan of care or course of treatment. Therefore, we ask that you please give [place] 2 business days to thoroughly review all your results before contacting the office for clarification. Should we see a critical lab result, you will be contacted sooner.   If You Need Anything After Your Visit  If you have any questions or concerns for your doctor, please call our main line at [phone] and press option 4 to reach your doctor's medical assistant. If no one answers, please leave a voicemail as directed and we will return your call as soon as possible. Messages left after 4 pm will be answered the following business day.   You may also send [place] a message via MyChart. We typically respond to MyChart messages within 1-2 business days.  For prescription refills, please ask your pharmacy to contact our  office. Our fax number is [phone].  If you have an urgent issue when the clinic is closed that cannot wait until the next business day, you can page your doctor at the number below.    Please note that while we do our best to be available for urgent issues outside of office hours, we are not available 24/7.   If you have an urgent issue and are unable to reach [place], you may choose to seek medical care at your doctor's office, retail clinic, urgent care center, or emergency room.  If you have a medical emergency, please immediately call 911 or go to the emergency department.  Pager Numbers  - Dr. Gwen Pounds: [phone]  - Dr. Roseanne Reno: [phone]  - Dr. Katrinka Blazing: [phone]   In the event of inclement weather, please call our main line at [phone] for an update on the status of any delays or closures.  Dermatology Medication Tips: Please keep the boxes that topical medications come in in order to help keep track of the instructions about where and how to use these. Pharmacies typically print the medication instructions only on the boxes and not directly on the medication tubes.   If your medication is too expensive, please contact our office at [phone] option 4 or send [place] a message through MyChart.   We are unable to tell what your co-pay for medications will be in advance as this is different depending on your insurance coverage. However, we may be able to find a substitute medication  at lower cost or fill out paperwork to get insurance to cover a needed medication.   If a prior authorization is required to get your medication covered by your insurance company, please allow [place] 1-2 business days to complete this process.  Drug prices often vary depending on where the prescription is filled and some pharmacies may offer cheaper prices.  The website www.goodrx.com contains coupons for medications through different pharmacies. The prices here do not account for what the cost  may be with help from insurance (it may be cheaper with your insurance), but the website can give you the price if you did not use any insurance.  - You can print the associated coupon and take it with your prescription to the pharmacy.  - You may also stop by our office during regular business hours and pick up a GoodRx coupon card.  - If you need your prescription sent electronically to a different pharmacy, notify our office through [facility] or by phone at [phone] option 4.     Si Usted Necesita Algo Despus de Su Visita  Tambin puede enviarnos un mensaje a travs de Clinical cytogeneticist. Por lo general respondemos a los mensajes de MyChart en el transcurso de 1 a 2 das hbiles.  Para renovar recetas, por favor pida a su farmacia que se ponga en contacto con nuestra oficina. Annie Sable de fax es [place] [phone].  Si tiene un asunto urgente cuando la clnica est cerrada y que no puede esperar hasta el siguiente da hbil, puede llamar/localizar a su doctor(a) al nmero que aparece a continuacin.   Por favor, tenga en cuenta que aunque hacemos todo lo posible para estar disponibles para asuntos urgentes fuera del horario de [place], no estamos disponibles las 24 horas del da, los 7 [address]  [address] de la [place].   Si tiene un problema urgente y no puede comunicarse con nosotros, puede optar por buscar atencin mdica  en el consultorio de su doctor(a), en una clnica privada, en un centro de atencin urgente o en una sala de emergencias.  Si tiene Engineer, drilling, por favor llame inmediatamente al 911 o vaya a la sala de emergencias.  Nmeros de bper  - Dr. Gwen Pounds: [phone]  - Dra. Roseanne Reno: [phone]  - Dr. Katrinka Blazing: [phone]   En caso de inclemencias del tiempo, por favor llame a Lacy Duverney principal al [phone] para una actualizacin sobre el [place] de cualquier retraso o cierre.  Consejos para la medicacin en dermatologa: Por favor, guarde las cajas en  las que vienen los medicamentos de uso tpico para ayudarle a seguir las instrucciones sobre dnde y cmo usarlos. Las farmacias generalmente imprimen las instrucciones del medicamento slo en las cajas y no directamente en los tubos del [place].   Si su medicamento es muy caro, por favor, pngase en contacto con Rolm Gala llamando al [phone] y presione la opcin 4 o envenos un mensaje a travs de Clinical cytogeneticist.   No podemos decirle cul ser su copago por los medicamentos por adelantado ya que esto es diferente dependiendo de la cobertura de su seguro. Sin embargo, es posible que podamos encontrar un medicamento sustituto a Audiological scientist un formulario para que el seguro cubra el medicamento que se considera necesario.   Si se requiere una autorizacin previa para que su compaa de seguros [place] su medicamento, por favor permtanos de 1 a 2 das hbiles para completar [address]  Los precios de los medicamentos varan con frecuencia dependiendo del Environmental consultant de  dnde se surte la receta y alguna farmacias pueden ofrecer precios ms baratos.  El sitio web www.goodrx.com tiene cupones para medicamentos de Health and safety inspector. Los precios aqu no tienen en cuenta lo que podra costar con la ayuda del seguro (puede ser ms barato con su seguro), pero el sitio web puede darle el precio si no utiliz Tourist information centre manager.  - Puede imprimir el cupn correspondiente y llevarlo con su receta a la farmacia.  - Tambin puede pasar por nuestra oficina durante el horario de atencin regular y Education officer, museum una tarjeta de cupones de GoodRx.  - Si necesita que su receta se enve electrnicamente a una farmacia diferente, informe a nuestra oficina a travs de MyChart de [place] o por telfono llamando al [phone] y presione la opcin 4.

## 2022-12-31 NOTE — Progress Notes (Signed)
   Follow-Up Visit   Subjective  John Davis is a 37 y.o. male who presents for the following: 3 month follow up of acne/folliculitis on back. Had improved but has been working in [place]. Flaring a little. Ran out Minocycline and the Sulfa wash. Prefers to hold on Isotretinoin for now. Will continue to help in [place].  The patient has spots, moles and lesions to be evaluated, some may be new or changing and the patient may have concern these could be cancer.   The following portions of the chart were reviewed this encounter and updated as appropriate: medications, allergies, medical history  Review of Systems:  No other skin or systemic complaints except as noted in HPI or Assessment and Plan.  Objective  Well appearing patient in no apparent distress; mood and affect are within normal limits.  A focused examination was performed of the following areas: Back  Relevant exam findings are noted in the Assessment and Plan.    Assessment & Plan   ACNE:FOLLICULITIS Exam: multiple Perifollicular erythematous papules on back  Chronic and persistent condition with duration or expected duration over one year. Condition is bothersome/symptomatic for patient. Currently flared.   Folliculitis occurs due to inflammation of the superficial hair follicle (pore), resulting in acne-like lesions (pus bumps). It can be infectious (bacterial, fungal) or noninfectious (shaving, tight clothing, heat/sweat, medications).  Folliculitis can be acute or chronic and recommended treatment depends on the underlying cause of folliculitis.  Treatment Plan: Continue Minocycline 100 mg once daily prn flares, increase to twice daily if not effective.  Start Amzeeq daily to affected areas once daily.  Recommend OTC Benzoyl peroxide wash daily in shower.  Benzoyl peroxide can cause dryness and irritation of the skin. It can also bleach fabric. When used together with Aczone (dapsone) cream, it can stain  the skin orange.    Return in about 6 months (around 06/30/2023) for Acne Follow Up.  I, Lawson Radar, CMA, am acting as scribe for Willeen Niece, MD.   Documentation: I have reviewed the above documentation for accuracy and completeness, and I agree with the above.  Willeen Niece, MD

## 2023-06-13 ENCOUNTER — Ambulatory Visit
Admission: RE | Admit: 2023-06-13 | Discharge: 2023-06-13 | Disposition: A | Discharge status: Home / Self Care | Source: Ambulatory Visit | Attending: Internal Medicine | Admitting: Internal Medicine

## 2023-06-13 VITALS — BP 132/96 | HR 60 | Temp 97.9°F | Resp 20

## 2023-06-13 DIAGNOSIS — D751 Secondary polycythemia: Secondary | ICD-10-CM | POA: Diagnosis present

## 2023-06-13 LAB — CBC WITH DIFFERENTIAL/PLATELET
Abs Immature Granulocytes: 0.02 10*3/uL (ref 0.00–0.07)
Basophils Absolute: 0.1 10*3/uL (ref 0.0–0.1)
Basophils Relative: 1 %
Eosinophils Absolute: 0.2 10*3/uL (ref 0.0–0.5)
Eosinophils Relative: 3 %
HCT: 55 % — ABNORMAL HIGH (ref 39.0–52.0)
Hemoglobin: 17.9 g/dL — ABNORMAL HIGH (ref 13.0–17.0)
Immature Granulocytes: 0 %
Lymphocytes Relative: 32 %
Lymphs Abs: 2.3 10*3/uL (ref 0.7–4.0)
MCH: 27.3 pg (ref 26.0–34.0)
MCHC: 32.5 g/dL (ref 30.0–36.0)
MCV: 83.8 fL (ref 80.0–100.0)
Monocytes Absolute: 0.5 10*3/uL (ref 0.1–1.0)
Monocytes Relative: 7 %
Neutro Abs: 4 10*3/uL (ref 1.7–7.7)
Neutrophils Relative %: 57 %
Platelets: 210 10*3/uL (ref 150–400)
RBC: 6.56 MIL/uL — ABNORMAL HIGH (ref 4.22–5.81)
RDW: 16.6 % — ABNORMAL HIGH (ref 11.5–15.5)
WBC: 7.1 10*3/uL (ref 4.0–10.5)
nRBC: 0 % (ref 0.0–0.2)

## 2023-07-15 ENCOUNTER — Ambulatory Visit: Payer: BC Managed Care – PPO | Admitting: Dermatology

## 2023-07-15 DIAGNOSIS — B078 Other viral warts: Secondary | ICD-10-CM

## 2023-07-15 DIAGNOSIS — B079 Viral wart, unspecified: Secondary | ICD-10-CM

## 2023-07-15 DIAGNOSIS — L739 Follicular disorder, unspecified: Secondary | ICD-10-CM

## 2023-07-15 NOTE — Progress Notes (Signed)
   Follow-Up Visit   Subjective  John Davis is a 38 y.o. male who presents for the following: Folliculitis of the back. Patient is not currently using Amzeeq  foam, BP wash, or taking minocycline . Nothing really worked, so he doesn't want to continue treatment at this time. He also has warts on the hands to treat today. Had 11 warts on hands treated in 2022 that cleared up with cryotherapy.   The following portions of the chart were reviewed this encounter and updated as appropriate: medications, allergies, medical history  Review of Systems:  No other skin or systemic complaints except as noted in HPI or Assessment and Plan.  Objective  Well appearing patient in no apparent distress; mood and affect are within normal limits.  A focused examination was performed of the following areas: Face, back  Relevant physical exam findings are noted in the Assessment and Plan.    Assessment & Plan  FOLLICULITIS Exam: Not examined today, not bothersome to patient.   Folliculitis occurs due to inflammation of the superficial hair follicle (pore), resulting in acne-like lesions (pus bumps). It can be infectious (bacterial, fungal) or noninfectious (shaving, tight clothing, heat/sweat, medications).  Folliculitis can be acute or chronic and recommended treatment depends on the underlying cause of folliculitis.  Treatment Plan: Patient not interested in treatment at this time.  May use otc BP wash in the shower. Benzoyl peroxide can cause dryness and irritation of the skin.   WART Exam: verrucous papule R index palmar x 2, base of R palm  Counseling Discussed viral / HPV (Human Papilloma Virus) etiology and risk of spread /infectivity to other areas of body as well as to other people.  Multiple treatments and methods may be required to clear warts and it is possible treatment may not be successful.  Treatment risks include discoloration; scarring and there is still potential for wart  recurrence.  Treatment Plan: Destruction Procedure Note Destruction method: cryotherapy   Informed consent: discussed and consent obtained   Lesion destroyed using liquid nitrogen: Yes   Outcome: patient tolerated procedure well with no complications   Post-procedure details: wound care instructions given   Locations: palmar R index x 2, base of R palm x 1 # of Lesions Treated: 3  Prior to procedure, discussed risks of blister formation, small wound, skin dyspigmentation, or rare scar following cryotherapy. Recommend Vaseline ointment to treated areas while healing.    Return in about 1 month (around 08/14/2023) for wart.  IBernardine Bridegroom, CMA, am acting as scribe for Artemio Larry, MD .   Documentation: I have reviewed the above documentation for accuracy and completeness, and I agree with the above.  Artemio Larry, MD

## 2023-07-15 NOTE — Patient Instructions (Signed)
 Viral Warts & Molluscum Contagiosum  Viral warts and molluscum contagiosum are growths of the skin caused by viral infection of the skin. If you have been given the diagnosis of viral warts or molluscum contagiosum there are a few things that you must understand about your condition:  There is no guaranteed treatment method available for this condition. Multiple treatments may be required, The treatments may be time consuming and require multiple visits to the dermatology office. The treatment may be expensive. You will be charged each time you come into the office to have the spots treated. The treated areas may develop new lesions further complicating treatment. The treated areas may leave a scar. There is no guarantee that even after multiple treatments that the spots will be successfully treated. These are caused by a viral infection and can be spread to other areas of the skin and to other people by direct contact. Therefore, new spots may occur.    Cryotherapy Aftercare  Wash gently with soap and water everyday.   Apply Vaseline and Band-Aid daily until healed.    Due to recent changes in healthcare laws, you may see results of your pathology and/or laboratory studies on MyChart before the doctors have had a chance to review them. We understand that in some cases there may be results that are confusing or concerning to you. Please understand that not all results are received at the same time and often the doctors may need to interpret multiple results in order to provide you with the best plan of care or course of treatment. Therefore, we ask that you please give us  2 business days to thoroughly review all your results before contacting the office for clarification. Should we see a critical lab result, you will be contacted sooner.   If You Need Anything After Your Visit  If you have any questions or concerns for your doctor, please call our main line at [phone] and press option  4 to reach your doctor's medical assistant. If no one answers, please leave a voicemail as directed and we will return your call as soon as possible. Messages left after 4 pm will be answered the following business day.   You may also send us  a message via MyChart. We typically respond to MyChart messages within 1-2 business days.  For prescription refills, please ask your pharmacy to contact our office. Our fax number is [phone].  If you have an urgent issue when the clinic is closed that cannot wait until the next business day, you can page your doctor at the number below.    Please note that while we do our best to be available for urgent issues outside of office hours, we are not available 24/7.   If you have an urgent issue and are unable to reach us , you may choose to seek medical care at your doctor's office, retail clinic, urgent care center, or emergency room.  If you have a medical emergency, please immediately call 911 or go to the emergency department.  Pager Numbers  - Dr. Bary Likes: [phone]  - Dr. Annette Barters: [phone]  - Dr. Felipe Horton: [phone]   In the event of inclement weather, please call our main line at [phone] for an update on the status of any delays or closures.  Dermatology Medication Tips: Please keep the boxes that topical medications come in in order to help keep track of the instructions about where and how to use these. Pharmacies typically print the medication instructions only on the boxes  and not directly on the medication tubes.   If your medication is too expensive, please contact our office at [phone] option 4 or send us  a message through MyChart.   We are unable to tell what your co-pay for medications will be in advance as this is different depending on your insurance coverage. However, we may be able to find a substitute medication at lower cost or fill out paperwork to get insurance to cover a needed medication.   If a prior  authorization is required to get your medication covered by your insurance company, please allow us  1-2 business days to complete this process.  Drug prices often vary depending on where the prescription is filled and some pharmacies may offer cheaper prices.  The website www.goodrx.com contains coupons for medications through different pharmacies. The prices here do not account for what the cost may be with help from insurance (it may be cheaper with your insurance), but the website can give you the price if you did not use any insurance.  - You can print the associated coupon and take it with your prescription to the pharmacy.  - You may also stop by our office during regular business hours and pick up a GoodRx coupon card.  - If you need your prescription sent electronically to a different pharmacy, notify our office through [facility] or by phone at [phone] option 4.     Si Usted Necesita Algo Despus de Su Visita  Tambin puede enviarnos un mensaje a travs de Clinical cytogeneticist. Por lo general respondemos a los mensajes de MyChart en el transcurso de 1 a 2 das hbiles.  Para renovar recetas, por favor pida a su farmacia que se ponga en contacto con nuestra oficina. Franz Jacks de fax es [place] [phone].  Si tiene un asunto urgente cuando la clnica est cerrada y que no puede esperar hasta el siguiente da hbil, puede llamar/localizar a su doctor(a) al nmero que aparece a continuacin.   Por favor, tenga en cuenta que aunque hacemos todo lo posible para estar disponibles para asuntos urgentes fuera del horario de [place], no estamos disponibles las 24 horas del da, los 7 [address]  [address] de la [place].   Si tiene un problema urgente y no puede comunicarse con nosotros, puede optar por buscar atencin mdica  en el consultorio de su doctor(a), en una clnica privada, en un centro de atencin urgente o en una sala de emergencias.  Si tiene Engineer, drilling, por favor llame  inmediatamente al 911 o vaya a la sala de emergencias.  Nmeros de bper  - Dr. Bary Likes: [phone]  - Dra. Annette Barters: [phone]  - Dr. Felipe Horton: [phone]   En caso de inclemencias del tiempo, por favor llame a Lajuan Pila principal al [phone] para una actualizacin sobre el [place] de cualquier retraso o cierre.  Consejos para la medicacin en dermatologa: Por favor, guarde las cajas en las que vienen los medicamentos de uso tpico para ayudarle a seguir las instrucciones sobre dnde y cmo usarlos. Las farmacias generalmente imprimen las instrucciones del medicamento slo en las cajas y no directamente en los tubos del [place].   Si su medicamento es muy caro, por favor, pngase en contacto con Bettyjane Brunet llamando al [phone] y presione la opcin 4 o envenos un mensaje a travs de Clinical cytogeneticist.   No podemos decirle cul ser su copago por los medicamentos por adelantado ya que esto es diferente dependiendo de la cobertura de su seguro. Sin embargo, es posible que podamos  encontrar un medicamento sustituto a Audiological scientist un formulario para que el seguro cubra el medicamento que se considera necesario.   Si se requiere una autorizacin previa para que su compaa de seguros [place] su medicamento, por favor permtanos de 1 a 2 das hbiles para completar este proceso.  Los precios de los medicamentos varan con frecuencia dependiendo del Environmental consultant de dnde se surte la receta y alguna farmacias pueden ofrecer precios ms baratos.  El sitio web www.goodrx.com tiene cupones para medicamentos de Health and safety inspector. Los precios aqu no tienen en cuenta lo que podra costar con la ayuda del seguro (puede ser ms barato con su seguro), pero el sitio web puede darle el precio si no utiliz Tourist information centre manager.  - Puede imprimir el cupn correspondiente y llevarlo con su receta a la farmacia.  - Tambin puede pasar por nuestra oficina durante el horario de atencin regular y  Education officer, museum una tarjeta de cupones de GoodRx.  - Si necesita que su receta se enve electrnicamente a una farmacia diferente, informe a nuestra oficina a travs de MyChart de [place] o por telfono llamando al [phone] y presione la opcin 4.

## 2023-08-26 ENCOUNTER — Ambulatory Visit: Admitting: Dermatology

## 2023-08-26 ENCOUNTER — Encounter: Payer: Self-pay | Admitting: Dermatology

## 2023-08-26 DIAGNOSIS — B078 Other viral warts: Secondary | ICD-10-CM | POA: Diagnosis not present

## 2023-08-26 NOTE — Progress Notes (Signed)
   Follow-Up Visit   Subjective  John Davis is a 38 y.o. male who presents for the following: Wart. 6 week follow up. Right index finger palmar, base of R palm. Tx with LN2 07/15/2023. Thinks warts treated may be gone but may have new wart.   The following portions of the chart were reviewed this encounter and updated as appropriate: medications, allergies, medical history  Review of Systems:  No other skin or systemic complaints except as noted in HPI or Assessment and Plan.  Objective  Well appearing patient in no apparent distress; mood and affect are within normal limits.  Areas Examined: Right hand  Relevant physical exam findings are noted in the Assessment and Plan.  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  Multiple treatments and methods may be required to clear warts and it is possible treatment may not be successful.  Treatment risks include discoloration; scarring and there is still potential for wart recurrence. Destruction of lesion - Right Palmar Index Finger x2 (2)  Destruction method: cryotherapy   Informed consent: discussed and consent obtained   Lesion destroyed using liquid nitrogen: Yes   Region frozen until ice ball extended beyond lesion: Yes   Outcome: patient tolerated procedure well with no complications   Post-procedure details: wound care instructions given   Additional details:  Prior to procedure, discussed risks of blister formation, small wound, skin dyspigmentation, or rare scar following cryotherapy. Recommend Vaseline ointment to treated areas while healing.       Return if symptoms worsen or fail to improve.  I, Jill Parcell, CMA, am acting  as scribe for Rexene Rattler, MD.   Documentation: I have reviewed the above documentation for accuracy and completeness, and I agree with the above.  Rexene Rattler, MD

## 2023-08-26 NOTE — Patient Instructions (Addendum)

## 2023-12-30 ENCOUNTER — Ambulatory Visit
Admission: RE | Admit: 2023-12-30 | Discharge: 2023-12-30 | Disposition: A | Discharge status: Home / Self Care | Source: Ambulatory Visit | Attending: Internal Medicine | Admitting: Internal Medicine

## 2023-12-30 VITALS — BP 138/86 | HR 70 | Resp 16

## 2023-12-30 DIAGNOSIS — D751 Secondary polycythemia: Secondary | ICD-10-CM | POA: Diagnosis present

## 2023-12-30 LAB — CBC
HCT: 53.4 % — ABNORMAL HIGH (ref 39.0–52.0)
Hemoglobin: 18.4 g/dL — ABNORMAL HIGH (ref 13.0–17.0)
MCH: 29.8 pg (ref 26.0–34.0)
MCHC: 34.5 g/dL (ref 30.0–36.0)
MCV: 86.5 fL (ref 80.0–100.0)
Platelets: 211 K/uL (ref 150–400)
RBC: 6.17 MIL/uL — ABNORMAL HIGH (ref 4.22–5.81)
RDW: 13.3 % (ref 11.5–15.5)
WBC: 7.1 K/uL (ref 4.0–10.5)
nRBC: 0 % (ref 0.0–0.2)

## 2024-02-27 ENCOUNTER — Ambulatory Visit
Admission: RE | Admit: 2024-02-27 | Discharge: 2024-02-27 | Disposition: A | Discharge status: Home / Self Care | Source: Ambulatory Visit | Attending: Internal Medicine | Admitting: Internal Medicine

## 2024-02-27 DIAGNOSIS — D751 Secondary polycythemia: Secondary | ICD-10-CM | POA: Diagnosis present

## 2024-04-06 ENCOUNTER — Ambulatory Visit: Admit: 2024-04-06 | Admitting: Internal Medicine
